# Patient Record
Sex: Female | Born: 1965 | Race: White | Hispanic: Yes | Marital: Married | Smoking: Never smoker
Health system: Southern US, Community
[De-identification: ages and names within clinical notes are randomized; demographics above are authoritative.]

---

## 2020-06-29 ENCOUNTER — Other Ambulatory Visit: Payer: Self-pay | Admitting: Obstetrics and Gynecology

## 2020-06-29 DIAGNOSIS — Z1231 Encounter for screening mammogram for malignant neoplasm of breast: Secondary | ICD-10-CM

## 2020-08-23 ENCOUNTER — Ambulatory Visit: Payer: Self-pay

## 2020-10-04 ENCOUNTER — Ambulatory Visit: Payer: Self-pay

## 2020-10-20 ENCOUNTER — Ambulatory Visit: Payer: Self-pay

## 2020-10-23 ENCOUNTER — Emergency Department (HOSPITAL_COMMUNITY): Payer: HRSA Program

## 2020-10-23 ENCOUNTER — Ambulatory Visit (HOSPITAL_COMMUNITY): Payer: Self-pay

## 2020-10-23 ENCOUNTER — Other Ambulatory Visit: Payer: Self-pay

## 2020-10-23 ENCOUNTER — Encounter (HOSPITAL_COMMUNITY): Payer: Self-pay | Admitting: Internal Medicine

## 2020-10-23 ENCOUNTER — Inpatient Hospital Stay (HOSPITAL_COMMUNITY)
Admission: EM | Admit: 2020-10-23 | Discharge: 2020-10-28 | DRG: 177 | Disposition: A | Discharge status: Home / Self Care | Payer: HRSA Program | Attending: Internal Medicine | Admitting: Internal Medicine

## 2020-10-23 DIAGNOSIS — J9601 Acute respiratory failure with hypoxia: Principal | ICD-10-CM

## 2020-10-23 DIAGNOSIS — A419 Sepsis, unspecified organism: Secondary | ICD-10-CM

## 2020-10-23 DIAGNOSIS — A0839 Other viral enteritis: Secondary | ICD-10-CM

## 2020-10-23 DIAGNOSIS — U071 COVID-19: Secondary | ICD-10-CM

## 2020-10-23 DIAGNOSIS — J1282 Pneumonia due to coronavirus disease 2019: Secondary | ICD-10-CM

## 2020-10-23 DIAGNOSIS — A4189 Other specified sepsis: Secondary | ICD-10-CM | POA: Diagnosis present

## 2020-10-23 LAB — CBC WITH DIFFERENTIAL/PLATELET
Abs Immature Granulocytes: 0.02 10*3/uL (ref 0.00–0.07)
Abs Immature Granulocytes: 0.02 10*3/uL (ref 0.00–0.07)
Basophils Absolute: 0 10*3/uL (ref 0.0–0.1)
Basophils Absolute: 0 10*3/uL (ref 0.0–0.1)
Basophils Relative: 0 %
Basophils Relative: 0 %
Eosinophils Absolute: 0 10*3/uL (ref 0.0–0.5)
Eosinophils Absolute: 0 10*3/uL (ref 0.0–0.5)
Eosinophils Relative: 0 %
Eosinophils Relative: 0 %
HCT: 39.1 % (ref 36.0–46.0)
HCT: 38.4 % (ref 36.0–46.0)
Hemoglobin: 13 g/dL (ref 12.0–15.0)
Hemoglobin: 12.7 g/dL (ref 12.0–15.0)
Immature Granulocytes: 0 %
Immature Granulocytes: 0 %
Lymphocytes Relative: 9 %
Lymphocytes Relative: 14 %
Lymphs Abs: 0.6 10*3/uL — ABNORMAL LOW (ref 0.7–4.0)
Lymphs Abs: 0.7 10*3/uL (ref 0.7–4.0)
MCH: 26.2 pg (ref 26.0–34.0)
MCH: 26.5 pg (ref 26.0–34.0)
MCHC: 33.2 g/dL (ref 30.0–36.0)
MCHC: 33.1 g/dL (ref 30.0–36.0)
MCV: 78.8 fL — ABNORMAL LOW (ref 80.0–100.0)
MCV: 80 fL (ref 80.0–100.0)
Monocytes Absolute: 0.2 10*3/uL (ref 0.1–1.0)
Monocytes Absolute: 0.3 10*3/uL (ref 0.1–1.0)
Monocytes Relative: 4 %
Monocytes Relative: 5 %
Neutro Abs: 5.5 10*3/uL (ref 1.7–7.7)
Neutro Abs: 4.1 10*3/uL (ref 1.7–7.7)
Neutrophils Relative %: 87 %
Neutrophils Relative %: 81 %
Platelets: 241 10*3/uL (ref 150–400)
Platelets: 227 10*3/uL (ref 150–400)
RBC: 4.96 MIL/uL (ref 3.87–5.11)
RBC: 4.8 MIL/uL (ref 3.87–5.11)
RDW: 13.4 % (ref 11.5–15.5)
RDW: 13.3 % (ref 11.5–15.5)
WBC: 6.3 10*3/uL (ref 4.0–10.5)
WBC: 5.1 10*3/uL (ref 4.0–10.5)
nRBC: 0 % (ref 0.0–0.2)
nRBC: 0 % (ref 0.0–0.2)

## 2020-10-23 LAB — COMPREHENSIVE METABOLIC PANEL
ALT: 51 U/L — ABNORMAL HIGH (ref 0–44)
AST: 69 U/L — ABNORMAL HIGH (ref 15–41)
Albumin: 2.8 g/dL — ABNORMAL LOW (ref 3.5–5.0)
Alkaline Phosphatase: 115 U/L (ref 38–126)
Anion gap: 11 (ref 5–15)
BUN: 10 mg/dL (ref 6–20)
CO2: 23 mmol/L (ref 22–32)
Calcium: 8.5 mg/dL — ABNORMAL LOW (ref 8.9–10.3)
Chloride: 99 mmol/L (ref 98–111)
Creatinine, Ser: 0.76 mg/dL (ref 0.44–1.00)
GFR, Estimated: >60 mL/min (ref >60)
Glucose, Bld: 133 mg/dL — ABNORMAL HIGH (ref 70–99)
Potassium: 4.3 mmol/L (ref 3.5–5.1)
Sodium: 133 mmol/L — ABNORMAL LOW (ref 135–145)
Total Bilirubin: 0.8 mg/dL (ref 0.3–1.2)
Total Protein: 6.8 g/dL (ref 6.5–8.1)

## 2020-10-23 LAB — FERRITIN: Ferritin: 1223 ng/mL — ABNORMAL HIGH (ref 11–307)

## 2020-10-23 LAB — C-REACTIVE PROTEIN: CRP: 17.6 mg/dL — ABNORMAL HIGH (ref <1.0)

## 2020-10-23 LAB — TRIGLYCERIDES: Triglycerides: 119 mg/dL (ref <150)

## 2020-10-23 LAB — LACTIC ACID, PLASMA: Lactic Acid, Venous: 1 mmol/L (ref 0.5–1.9)

## 2020-10-23 LAB — D-DIMER, QUANTITATIVE: D-Dimer, Quant: 1.13 ug/mL-FEU — ABNORMAL HIGH (ref 0.00–0.50)

## 2020-10-23 LAB — PROCALCITONIN: Procalcitonin: 0.26 ng/mL

## 2020-10-23 LAB — LACTATE DEHYDROGENASE: LDH: 408 U/L — ABNORMAL HIGH (ref 98–192)

## 2020-10-23 LAB — FIBRINOGEN: Fibrinogen: 682 mg/dL — ABNORMAL HIGH (ref 210–475)

## 2020-10-23 LAB — SARS CORONAVIRUS 2 BY RT PCR (HOSPITAL ORDER, PERFORMED IN ~~LOC~~ HOSPITAL LAB): SARS Coronavirus 2: POSITIVE — AB

## 2020-10-23 MED ORDER — ONDANSETRON 4 MG PO TBDP
4.0000 mg | ORAL_TABLET | Freq: Once | ORAL | Status: AC
  Administered 2020-10-23: 4 mg via ORAL
  Filled 2020-10-23: qty 1

## 2020-10-23 MED ORDER — ALBUTEROL SULFATE HFA 108 (90 BASE) MCG/ACT IN AERS
2.0000 | INHALATION_SPRAY | RESPIRATORY_TRACT | Status: DC | PRN
  Administered 2020-10-24: 2 via RESPIRATORY_TRACT
  Filled 2020-10-23: qty 6.7

## 2020-10-23 MED ORDER — SODIUM CHLORIDE 0.9 % IV SOLN: INTRAVENOUS | Status: AC

## 2020-10-23 MED ORDER — ASCORBIC ACID 500 MG PO TABS
500.0000 mg | ORAL_TABLET | Freq: Every day | ORAL | Status: DC
  Administered 2020-10-24 – 2020-10-28 (×5): 500 mg via ORAL
  Filled 2020-10-23 (×5): qty 1

## 2020-10-23 MED ORDER — PREDNISONE 20 MG PO TABS: 50.0000 mg | ORAL_TABLET | Freq: Every day | ORAL | Status: DC

## 2020-10-23 MED ORDER — ACETAMINOPHEN 325 MG PO TABS
650.0000 mg | ORAL_TABLET | Freq: Once | ORAL | Status: AC
  Administered 2020-10-23: 650 mg via ORAL
  Filled 2020-10-23: qty 2

## 2020-10-23 MED ORDER — SODIUM CHLORIDE 0.9 % IV SOLN
100.0000 mg | Freq: Every day | INTRAVENOUS | Status: AC
  Administered 2020-10-24 – 2020-10-27 (×4): 100 mg via INTRAVENOUS
  Filled 2020-10-23 (×4): qty 20

## 2020-10-23 MED ORDER — SODIUM CHLORIDE 0.9 % IV SOLN
200.0000 mg | Freq: Once | INTRAVENOUS | Status: AC
  Administered 2020-10-24: 200 mg via INTRAVENOUS
  Filled 2020-10-23: qty 200

## 2020-10-23 MED ORDER — METHYLPREDNISOLONE SODIUM SUCC 125 MG IJ SOLR
0.5000 mg/kg | Freq: Two times a day (BID) | INTRAMUSCULAR | Status: AC
  Administered 2020-10-24 – 2020-10-26 (×6): 41.25 mg via INTRAVENOUS
  Filled 2020-10-23 (×6): qty 2

## 2020-10-23 MED ORDER — GUAIFENESIN-DM 100-10 MG/5ML PO SYRP: 10.0000 mL | ORAL_SOLUTION | ORAL | Status: DC | PRN

## 2020-10-23 MED ORDER — HYDROCOD POLST-CPM POLST ER 10-8 MG/5ML PO SUER
5.0000 mL | Freq: Two times a day (BID) | ORAL | Status: DC | PRN
  Administered 2020-10-25: 5 mL via ORAL
  Filled 2020-10-23: qty 5

## 2020-10-23 MED ORDER — ENOXAPARIN SODIUM 40 MG/0.4ML ~~LOC~~ SOLN
40.0000 mg | SUBCUTANEOUS | Status: DC
  Administered 2020-10-24 – 2020-10-27 (×5): 40 mg via SUBCUTANEOUS
  Filled 2020-10-23 (×5): qty 0.4

## 2020-10-23 MED ORDER — VITAMIN D 25 MCG (1000 UNIT) PO TABS
1000.0000 [IU] | ORAL_TABLET | Freq: Every day | ORAL | Status: DC
  Administered 2020-10-24 – 2020-10-28 (×5): 1000 [IU] via ORAL
  Filled 2020-10-23 (×5): qty 1

## 2020-10-23 MED ORDER — ONDANSETRON HCL 4 MG/2ML IJ SOLN: 4.0000 mg | Freq: Four times a day (QID) | INTRAMUSCULAR | Status: DC | PRN

## 2020-10-23 MED ORDER — BARICITINIB 2 MG PO TABS
4.0000 mg | ORAL_TABLET | Freq: Every day | ORAL | Status: DC
  Administered 2020-10-24 – 2020-10-28 (×6): 4 mg via ORAL
  Filled 2020-10-23 (×6): qty 2

## 2020-10-23 MED ORDER — ZINC SULFATE 220 (50 ZN) MG PO CAPS
220.0000 mg | ORAL_CAPSULE | Freq: Every day | ORAL | Status: DC
  Administered 2020-10-24 – 2020-10-28 (×5): 220 mg via ORAL
  Filled 2020-10-23 (×5): qty 1

## 2020-10-23 MED ORDER — LOPERAMIDE HCL 2 MG PO CAPS: 2.0000 mg | ORAL_CAPSULE | ORAL | Status: DC | PRN

## 2020-10-23 MED ORDER — ACETAMINOPHEN 325 MG PO TABS: 650.0000 mg | ORAL_TABLET | Freq: Four times a day (QID) | ORAL | Status: DC | PRN

## 2020-10-23 MED ORDER — SODIUM CHLORIDE 0.9 % IV BOLUS
500.0000 mL | Freq: Once | INTRAVENOUS | Status: AC
  Administered 2020-10-23: 500 mL via INTRAVENOUS

## 2020-10-23 NOTE — ED Notes (Signed)
Pt assisted to bedside commode

## 2020-10-23 NOTE — H&P (Signed)
History and Physical    Sabrina Russell RXV:[id number] DOB: 1966-09-28 DOA: 10/23/2020  PCP: No primary care provider on file. Patient coming from: Home  Chief Complaint: Fever  HPI: Sabrina Russell is a 55 y.o. female with no significant past medical history presenting with complaints of fever and cough.  Patient states she has been sick for the past 2 weeks.  She is having fevers and cough.  Her appetite is poor.  She is having nausea but has not vomited.  She is also having some diarrhea.  Denies abdominal pain.  She feels her breathing is okay.  Denies chest pain.  She is not vaccinated against COVID.  States her husband is also sick with similar symptoms.  Denies history of any medical problems.  ED Course: Febrile with temperature 100.3 F.  Tachycardic on arrival.  Tachypneic.  Blood pressure soft.  Satting 87% on room air with ambulation, placed on supplemental oxygen.  SARS-CoV-2 PCR test positive.  WBC 6.3, hemoglobin 13.0, platelet count 241K.  Sodium 133, potassium 4.3, chloride 99, bicarb 23, BUN 10, creatinine 0.7, glucose 133.  AST 69, ALT 51.  Alk phos and T bili normal.  Blood culture x2 pending.  Lactic acid 1.0.  Inflammatory markers pending.  Procalcitonin level pending.  Chest x-ray showing bilateral pulmonary opacities, left greater than right most consistent with multifocal pneumonia. Patient was given Tylenol, Zofran, and 500 cc normal saline.  Review of Systems:  All systems reviewed and apart from history of presenting illness, are negative.  History reviewed. No pertinent past medical history.  History reviewed. No pertinent surgical history.   reports that she has never smoked. She has never used smokeless tobacco. She reports that she does not drink alcohol and does not use drugs.  No Known Allergies  History reviewed. No pertinent family history.  Prior to Admission medications   Medication Sig Start Date End Date Taking? Authorizing Provider   Pseudoeph-Doxylamine-DM-APAP (NYQUIL PO) Take 1 Dose by mouth daily as needed (cold symptoms).   Yes [provider]  Pseudoephedrine-APAP-DM (DAYQUIL MULTI-SYMPTOM PO) Take 1 Dose by mouth daily as needed (cold symptoms).   Yes [provider]    Physical Exam: Vitals:   10/23/20 1819 10/23/20 2108 10/23/20 2201 10/23/20 2230  BP: (!) 95/53 103/66 105/64 101/65  Pulse: 93 79 80 70  Resp: 20 (!) 21 (!) 22 (!) 29  Temp:  98.3 F (36.8 C)    TempSrc:  Oral    SpO2: 93% 94% (!) 89% 93%  Weight:      Height:        Physical Exam Constitutional:      General: She is not in acute distress.    Appearance: She is ill-appearing.  HENT:     Head: Normocephalic and atraumatic.  Eyes:     Extraocular Movements: Extraocular movements intact.     Conjunctiva/sclera: Conjunctivae normal.  Cardiovascular:     Rate and Rhythm: Normal rate and regular rhythm.     Pulses: Normal pulses.  Pulmonary:     Breath sounds: No wheezing.     Comments: Respiratory rate in the mid 20s Satting 95% on 3 L supplemental oxygen Abdominal:     General: Bowel sounds are normal. There is no distension.     Palpations: Abdomen is soft.     Tenderness: There is no abdominal tenderness.  Musculoskeletal:        General: No swelling or tenderness.     Cervical back: Normal range of  motion and neck supple.  Skin:    General: Skin is warm and dry.  Neurological:     General: No focal deficit present.     Mental Status: She is alert and oriented to person, place, and time.     Labs on Admission: I have personally reviewed following labs and imaging studies  CBC: Recent Labs  Lab 10/23/20 1445 10/23/20 2222  WBC 6.3 5.1  NEUTROABS 5.5 4.1  HGB 13.0 12.7  HCT 39.1 38.4  MCV 78.8* 80.0  PLT 241 227   Basic Metabolic Panel: Recent Labs  Lab 10/23/20 1445  NA 133*  K 4.3  CL 99  CO2 23  GLUCOSE 133*  BUN 10  CREATININE 0.76  CALCIUM 8.5*   GFR: Estimated Creatinine  Clearance: 93.2 mL/min (by C-G formula based on SCr of 0.76 mg/dL). Liver Function Tests: Recent Labs  Lab 10/23/20 1445  AST 69*  ALT 51*  ALKPHOS 115  BILITOT 0.8  PROT 6.8  ALBUMIN 2.8*   No results for input(s): LIPASE, AMYLASE in the last 168 hours. No results for input(s): AMMONIA in the last 168 hours. Coagulation Profile: No results for input(s): INR, PROTIME in the last 168 hours. Cardiac Enzymes: No results for input(s): CKTOTAL, CKMB, CKMBINDEX, TROPONINI in the last 168 hours. BNP (last 3 results) No results for input(s): PROBNP in the last 8760 hours. HbA1C: No results for input(s): HGBA1C in the last 72 hours. CBG: No results for input(s): GLUCAP in the last 168 hours. Lipid Profile: Recent Labs    10/23/20 2157  TRIG 119   Thyroid Function Tests: No results for input(s): TSH, T4TOTAL, FREET4, T3FREE, THYROIDAB in the last 72 hours. Anemia Panel: Recent Labs    10/23/20 2222  FERRITIN 1,223*   Urine analysis: No results found for: COLORURINE, APPEARANCEUR, LABSPEC, PHURINE, GLUCOSEU, HGBUR, BILIRUBINUR, KETONESUR, PROTEINUR, UROBILINOGEN, NITRITE, LEUKOCYTESUR  Radiological Exams on Admission: DG Chest Portable 1 View  Result Date: 10/23/2020 CLINICAL DATA:  54-year-old female with cough and fever. EXAM: PORTABLE CHEST 1 VIEW COMPARISON:  None. FINDINGS: Bilateral pulmonary opacities, left greater right most consistent with multifocal pneumonia. Clinical correlation and follow-up to resolution recommended. There is no pleural effusion pneumothorax. Mild cardiomegaly. No acute osseous pathology. IMPRESSION: Multifocal pneumonia. Electronically Signed   By: Arash  Radparvar M.D.   On: 10/23/2020 15:18    EKG: Independently reviewed.  Sinus rhythm, no prior tracing for comparison.  Assessment/Plan Principal Problem:   Pneumonia due to COVID-19 virus Active Problems:   Acute hypoxemic respiratory failure (HCC)   Sepsis (HCC)   Gastroenteritis due to  COVID-19 virus   Sepsis and acute hypoxemic respiratory failure secondary to COVID-19 viral pneumonia: She is unvaccinated.  SPO2 87% on room air with ambulation, currently satting in the mid 90s on 3 L supplemental oxygen.  Respiratory rate in the mid 20s.  Met criteria for sepsis in the ED-3 SIRS (fever, tachycardia, tachypnea) and SARS-CoV-2 PCR test positive.  Blood pressure soft initially but improved with 500 cc fluid bolus and tachycardia resolved.  No lactic acidosis to suggest severe sepsis.  Chest x-ray showing bilateral pulmonary opacities, left greater than right most consistent with multifocal pneumonia.  No leukocytosis to suggest bacterial pneumonia. -Remdesivir -IV Solu-Medrol 0.5 mg/kg every 12 hours -Discussed FDA's emergency use authorization of baricitinib for the treatment of Covid infection in hospitalized patients requiring supplemental oxygen.  Patient denies history of hepatitis, HIV, malignancy, or active TB.  Risks versus benefits discussed with the patient and she   wishes to proceed with using baricitinib to treat her illness.  Start baricitinib 4 mg daily. -Vitamin C, zinc, vitamin D -Antitussives as needed -Tylenol as needed -Bronchodilator as needed -Inflammatory markers including ferritin, fibrinogen, D-dimer, CRP, LDH pending -Procalcitonin level pending -Daily CBC with differential, CMP, CRP, D-dimer -Airborne and contact precautions -Incentive spirometry, flutter valve -Encourage prone positioning -Continuous pulse ox -Supplemental oxygen as needed to keep oxygen saturation above 90% -Blood culture x2 pending -Encourage mobility-PT/OT  COVID-19 viral gastroenteritis: Complaining of nausea and diarrhea.  Transaminases mildly elevated in the setting of acute viral illness.  Alk phos and T bili normal.  No leukocytosis on labs.  Abdominal exam benign. -Symptomatic management: Antiemetic as needed, loperamide as needed, gentle IV fluid hydration  DVT prophylaxis:  Lovenox Code Status: Full code Family Communication: No family available at this time. Disposition Plan: Status is: Inpatient  Remains inpatient appropriate because:IV treatments appropriate due to intensity of illness or inability to take PO, Inpatient level of care appropriate due to severity of illness and Acute hypoxemic respiratory failure secondary to COVID-19 viral pneumonia   Dispo: The patient is from: Home              Anticipated d/c is to: Home              Anticipated d/c date is: 3 days              Patient currently is not medically stable to d/c.   Difficult to place patient No  The medical decision making on this patient was of high complexity and the patient is at high risk for clinical deterioration, therefore this is a level 3 visit.  Shela Leff MD [facility]  If 7PM-7AM, please contact night-coverage [email or web address]  10/23/2020, 11:39 PM

## 2020-10-23 NOTE — ED Notes (Signed)
spo2 drops to 87% on room air while ambulating.

## 2020-10-23 NOTE — ED Triage Notes (Signed)
C/o feeling sick x 2 weeks; fever, NV, diarrhea.

## 2020-10-23 NOTE — ED Provider Notes (Signed)
[facility] EMERGENCY DEPARTMENT Provider Note   CSN: [id number] Arrival date & time: 10/23/20  1401     History Chief Complaint  Patient presents with  . Fever    Sabrina Russell is a 55 y.o. female with no past medical history.  Patient presents with chief complaint of cough and fever.  Patient reports that her symptoms began 2 weeks prior.  Patient reports that her cough has worsened over that time, cough is productive, worse at night, no relief with over-the-counter cough medications.  Patient also endorses nausea and headaches intermittently over the last 2 weeks.  Patient also complains of pleuritic chest pain and chest pain with coughing.  Patient denies any chest pain at rest or with exertion.  Patient denies any recent sick contacts.  Patient denies any vaccination for COVID-19 or influenza.  Patient denies any lung disease.  Patient denies any oxygen use at home.  Patient states that her husband is also sick with similar symptoms.  HPI     No past medical history on file.  There are no problems to display for this patient.     OB History   No obstetric history on file.     No family history on file.     Home Medications Prior to Admission medications   Not on File    Allergies    Patient has no known allergies.  Review of Systems   Review of Systems  Constitutional: Positive for chills and fever.  HENT: Positive for rhinorrhea. Negative for congestion and sore throat.   Eyes: Negative for visual disturbance.  Respiratory: Positive for cough (productive). Negative for shortness of breath.   Cardiovascular: Positive for chest pain (with coughing and pleuritic). Negative for palpitations and leg swelling.  Gastrointestinal: Positive for nausea. Negative for abdominal pain and vomiting.  Genitourinary: Negative for difficulty urinating and dysuria.  Musculoskeletal: Negative for back pain and neck pain.  Skin: Negative for color change  and rash.  Neurological: Positive for headaches. Negative for dizziness, syncope and light-headedness.  Psychiatric/Behavioral: Negative for confusion.    Physical Exam Updated Vital Signs BP 103/66   Pulse 79   Temp 98.3 F (36.8 C) (Oral)   Resp (!) 21   Ht 5' 8.9" (1.75 m)   Wt 82.6 kg   LMP  (LMP Unknown)   SpO2 94%   BMI 26.96 kg/m   Physical Exam Vitals and nursing note reviewed.  Constitutional:      General: She is not in acute distress.    Appearance: She is obese. She is ill-appearing (labored breathing, uncomfortable appearing). She is not toxic-appearing or diaphoretic.  HENT:     Head: Normocephalic.  Eyes:     General: No scleral icterus.       Right eye: No discharge.        Left eye: No discharge.  Cardiovascular:     Rate and Rhythm: Normal rate.     Heart sounds: Normal heart sounds.  Pulmonary:     Effort: Tachypnea present. No accessory muscle usage or respiratory distress.     Breath sounds: Normal breath sounds. No stridor. No decreased breath sounds, wheezing, rhonchi or rales.  Abdominal:     General: Abdomen is protuberant.     Palpations: Abdomen is soft. There is no mass or pulsatile mass.     Tenderness: There is no abdominal tenderness. There is no guarding.  Musculoskeletal:     Cervical back: Neck supple. No rigidity.  Right lower leg: No swelling or tenderness. No edema.     Left lower leg: No swelling or tenderness. No edema.  Skin:    General: Skin is warm and dry.     Coloration: Skin is not pale.  Neurological:     General: No focal deficit present.     Mental Status: She is alert.  Psychiatric:        Behavior: Behavior is cooperative.     ED Results / Procedures / Treatments   Labs (all labs ordered are listed, but only abnormal results are displayed) Labs Reviewed  SARS CORONAVIRUS 2 BY RT PCR (HOSPITAL ORDER, PERFORMED IN [facility]) - Abnormal; Notable for the following components:      Result Value    SARS Coronavirus 2 POSITIVE (*)    All other components within normal limits  CBC WITH DIFFERENTIAL/PLATELET - Abnormal; Notable for the following components:   MCV 78.8 (*)    Lymphs Abs 0.6 (*)    All other components within normal limits  COMPREHENSIVE METABOLIC PANEL - Abnormal; Notable for the following components:   Sodium 133 (*)    Glucose, Bld 133 (*)    Calcium 8.5 (*)    Albumin 2.8 (*)    AST 69 (*)    ALT 51 (*)    All other components within normal limits  CULTURE, BLOOD (ROUTINE X 2)  CULTURE, BLOOD (ROUTINE X 2)  LACTIC ACID, PLASMA  LACTIC ACID, PLASMA    EKG EKG Interpretation  Date/Time:  Sunday October 23 2020 21:53:54 EST Ventricular Rate:  82 PR Interval:    QRS Duration: 84 QT Interval:  394 QTC Calculation: 461 R Axis:   3 Text Interpretation: Sinus rhythm Consider left atrial enlargement No previous tracing Confirmed by Tilden Fossa [phone]) on 10/23/2020 9:59:33 PM   Radiology DG Chest Portable 1 View  Result Date: 10/23/2020 CLINICAL DATA:  55 year old female with cough and fever. EXAM: PORTABLE CHEST 1 VIEW COMPARISON:  None. FINDINGS: Bilateral pulmonary opacities, left greater right most consistent with multifocal pneumonia. Clinical correlation and follow-up to resolution recommended. There is no pleural effusion pneumothorax. Mild cardiomegaly. No acute osseous pathology. IMPRESSION: Multifocal pneumonia. Electronically Signed   By: Elgie Collard M.D.   On: 10/23/2020 15:18    Procedures .Critical Care Performed by: Haskel Schroeder, PA-C Authorized by: Haskel Schroeder, PA-C   Critical care provider statement:    Critical care time (minutes):  30   Critical care was necessary to treat or prevent imminent or life-threatening deterioration of the following conditions:  Respiratory failure   Critical care was time spent personally by me on the following activities:  Discussions with consultants, evaluation of patient's  response to treatment, examination of patient, ordering and performing treatments and interventions, ordering and review of laboratory studies, ordering and review of radiographic studies, pulse oximetry, re-evaluation of patient's condition and obtaining history from patient or surrogate   Care discussed with: admitting provider     (including critical care time)  Medications Ordered in ED Medications  acetaminophen (TYLENOL) tablet 650 mg (650 mg Oral Given 10/23/20 2203)  ondansetron (ZOFRAN-ODT) disintegrating tablet 4 mg (4 mg Oral Given 10/23/20 2203)  sodium chloride 0.9 % bolus 500 mL (500 mLs Intravenous New Bag/Given 10/23/20 2240)    ED Course  I have reviewed the triage vital signs and the nursing notes.  Pertinent labs & imaging results that were available during my care of the patient were reviewed by  me and considered in my medical decision making (see chart for details).    MDM Rules/Calculators/A&P                          Alert 55 year old in no acute distress, ill appearing with tachypnea, increased work of breathing and appears uncomfortable.  Patient is on 3 L of oxygen via nasal cannula.  Patient denies any history of lung disease or any oxygen use at home.  During interview oxygen was turned off and oxygen saturation was found to drop to 87% on room air at rest.  Lungs clear to auscultation bilaterally.  Noted to have nonproductive cough; coughing frequently throughout interview.  Patient reports productive cough, fever, nausea, and headache for 2 weeks.  COVID-19 test positive.  Chest x-ray shows multifocal pneumonia.  Patient's symptoms and increased work of breathing breathing are likely secondary to COVID-19 infection.  Due to patient's positive COVID 19 test, increased work of breathing, and pleuritic chest pain D-dimer ordered and pending.  If positive will follow-up with CT angio chest.  Will check oxygen saturation with ambulation.  Likely patient will need  admission due to hypoxia associated with her COVID-19 infection.  Lactic acid within normal limits at 1.0.  CBC was unremarkable.  CMP showed AST and ALT elevated at 69 and 51 respectively.  Patient denies any abdominal pain abdomen is soft, nondistended, nontender, no guarding.  Blood culture pending.    Patient was given Tylenol, zofran and normal saline fluid bolus.    22:54 spoke with Dr. Loney Loh, who will see the patient for admission.    Critical care procedure was placed due to respiratory failure.  Patient has hypoxia secondary to covid 19 pneumonia.    Patient was discussed with and evaluated by Dr. Madilyn Hook.    Sabrina Russell was evaluated in Emergency Department on 10/23/2020 for the symptoms described in the history of present illness. She was evaluated in the context of the global COVID-19 pandemic, which necessitated consideration that the patient might be at risk for infection with the SARS-CoV-2 virus that causes COVID-19. Institutional protocols and algorithms that pertain to the evaluation of patients at risk for COVID-19 are in a state of rapid change based on information released by regulatory bodies including the CDC and federal and state organizations. These policies and algorithms were followed during the patient's care in the ED.     Final Clinical Impression(s) / ED Diagnoses Final diagnoses:  COVID-19  Acute respiratory failure with hypoxia [facility])    Rx / DC Orders ED Discharge Orders    None       Berneice Heinrich 10/23/20 2322    Tilden Fossa, MD 10/27/20 [phone]

## 2020-10-24 LAB — CBC WITH DIFFERENTIAL/PLATELET
Abs Immature Granulocytes: 0.01 10*3/uL (ref 0.00–0.07)
Basophils Absolute: 0 10*3/uL (ref 0.0–0.1)
Basophils Relative: 0 %
Eosinophils Absolute: 0 10*3/uL (ref 0.0–0.5)
Eosinophils Relative: 0 %
HCT: 34.8 % — ABNORMAL LOW (ref 36.0–46.0)
Hemoglobin: 11.9 g/dL — ABNORMAL LOW (ref 12.0–15.0)
Immature Granulocytes: 0 %
Lymphocytes Relative: 16 %
Lymphs Abs: 0.6 10*3/uL — ABNORMAL LOW (ref 0.7–4.0)
MCH: 27 pg (ref 26.0–34.0)
MCHC: 34.2 g/dL (ref 30.0–36.0)
MCV: 79.1 fL — ABNORMAL LOW (ref 80.0–100.0)
Monocytes Absolute: 0.1 10*3/uL (ref 0.1–1.0)
Monocytes Relative: 3 %
Neutro Abs: 2.9 10*3/uL (ref 1.7–7.7)
Neutrophils Relative %: 81 %
Platelets: 212 10*3/uL (ref 150–400)
RBC: 4.4 MIL/uL (ref 3.87–5.11)
RDW: 13.5 % (ref 11.5–15.5)
WBC: 3.6 10*3/uL — ABNORMAL LOW (ref 4.0–10.5)
nRBC: 0 % (ref 0.0–0.2)

## 2020-10-24 LAB — COMPREHENSIVE METABOLIC PANEL
ALT: 56 U/L — ABNORMAL HIGH (ref 0–44)
AST: 78 U/L — ABNORMAL HIGH (ref 15–41)
Albumin: 2.6 g/dL — ABNORMAL LOW (ref 3.5–5.0)
Alkaline Phosphatase: 101 U/L (ref 38–126)
Anion gap: 10 (ref 5–15)
BUN: 7 mg/dL (ref 6–20)
CO2: 23 mmol/L (ref 22–32)
Calcium: 8.3 mg/dL — ABNORMAL LOW (ref 8.9–10.3)
Chloride: 103 mmol/L (ref 98–111)
Creatinine, Ser: 0.63 mg/dL (ref 0.44–1.00)
GFR, Estimated: >60 mL/min (ref >60)
Glucose, Bld: 145 mg/dL — ABNORMAL HIGH (ref 70–99)
Potassium: 4.2 mmol/L (ref 3.5–5.1)
Sodium: 136 mmol/L (ref 135–145)
Total Bilirubin: 0.9 mg/dL (ref 0.3–1.2)
Total Protein: 6.1 g/dL — ABNORMAL LOW (ref 6.5–8.1)

## 2020-10-24 LAB — D-DIMER, QUANTITATIVE: D-Dimer, Quant: 1.02 ug/mL-FEU — ABNORMAL HIGH (ref 0.00–0.50)

## 2020-10-24 LAB — PROCALCITONIN: Procalcitonin: 0.23 ng/mL

## 2020-10-24 LAB — C-REACTIVE PROTEIN: CRP: 14.8 mg/dL — ABNORMAL HIGH (ref <1.0)

## 2020-10-24 NOTE — Progress Notes (Signed)
2200 Patient took herself off oxygen and went to bathroom.Patient oxygen saturations dropped to 80%.Assisted patient back to bed and oxygen placed back on at 3 liters but oxygen saturation 84%. Respiratory therapy called for high flow oxygen.Oxygen gradually increased up to 13 liters high flow to maintain  oxygen saturations of 90%. Dr.Zierle-Ghosh notified of above.New orders received.

## 2020-10-24 NOTE — ED Notes (Signed)
Pt disconnected from monitoring and assisted to bedside commode Pt with increased shortness of breath and labored breathing with acitibity O2 sats dropped to 84% during ambulation and then back to 92% after resting, remains on 3L [place].  Skin is warm,dry and intact Call light within reach

## 2020-10-24 NOTE — ED Notes (Signed)
RN attempted to call report 

## 2020-10-24 NOTE — ED Notes (Signed)
Family updated at this time.

## 2020-10-24 NOTE — ED Notes (Signed)
Tele Breakfast Order Placed 

## 2020-10-24 NOTE — ED Notes (Signed)
Pt seated on side of bed to eat breakfast

## 2020-10-24 NOTE — ED Notes (Signed)
RN attempted report 

## 2020-10-24 NOTE — Progress Notes (Signed)
Called to get report no answer.

## 2020-10-24 NOTE — Progress Notes (Signed)
PROGRESS NOTE    Sabrina Russell  XKG:[id number] DOB: 02-06-1966 DOA: 10/23/2020 PCP: Pcp, No   Brief Narrative: Taken from H&P. Sabrina Russell is a 55 y.o. female with no significant past medical history presenting with complaints of fever and cough.  Patient states she has been sick for the past 2 weeks.  She is having fevers and cough.  Her appetite is poor.  She is having nausea but has not vomited.  She is also having some diarrhea.  Denies abdominal pain.  She feels her breathing is okay.  Denies chest pain.  She is not vaccinated against COVID.  States her husband is also sick with similar symptoms.  Denies history of any medical problems. She was found to be febrile, positive for COVID-19, chest x-ray with bilateral pulmonary opacities, hypoxic requiring 3 to 4 L of oxygen. She was started on remdesivir, steroid and baricitinib.  Subjective: Patient continued to have some shortness of breath, stating that she is little improved as compared to yesterday.  Saturating barely at 88 to 90% on 3 L of oxygen.  Assessment & Plan:   Principal Problem:   Pneumonia due to COVID-19 virus Active Problems:   Acute hypoxemic respiratory failure (HCC)   Sepsis (HCC)   Gastroenteritis due to COVID-19 virus  Acute hypoxic respiratory failure secondary to COVID-19 pneumonia. Elevated inflammatory markers which started improving today. Procalcitonin at 0.26 and repeat today was 0.23. Continue to require 3 to 4 L of oxygen. -Continue with remdesivir and steroid-day 2 -Continue with baricitinib-day 2 -Supplemental oxygen to keep the saturation above 90% -Continue with supportive care and supplement -Prone as tolerated -Incentive spirometry and flutter valve. -Continue to monitor inflammatory markers.  Transaminitis.  Most likely secondary to COVID-19 infection and related gastroenteritis. -Continue to monitor  Objective: Vitals:   10/24/20 1100 10/24/20 1230 10/24/20 1400 10/24/20  1700  BP: 111/73 115/70 115/73 113/81  Pulse: 61 (!) 59 61 65  Resp:  20 20 (!) 29  Temp:   98.2 F (36.8 C)   TempSrc:   Oral   SpO2: 91% 90% 91% (!) 87%  Weight:      Height:        Intake/Output Summary (Last 24 hours) at 10/24/2020 1720 Last data filed at 10/24/2020 0240 Gross per 24 hour  Intake 750 ml  Output --  Net 750 ml   Filed Weights   10/23/20 1432  Weight: 82.6 kg    Examination:  General exam: Appears calm and comfortable  Respiratory system: Clear to auscultation. Respiratory effort normal. Cardiovascular system: S1 & S2 heard, RRR.  Gastrointestinal system: Soft, nontender, nondistended, bowel sounds positive. Central nervous system: Alert and oriented. No focal neurological deficits. Extremities: No edema, no cyanosis, pulses intact and symmetrical. Psychiatry: Judgement and insight appear normal.    DVT prophylaxis: Lovenox Code Status: Full Family Communication: Son was updated on phone. Disposition Plan:  Status is: Inpatient  Remains inpatient appropriate because:Inpatient level of care appropriate due to severity of illness   Dispo: The patient is from: Home              Anticipated d/c is to: Home              Anticipated d/c date is: 3 days              Patient currently is not medically stable to d/c.   Difficult to place patient No   Consultants:   None  Procedures:  Antimicrobials:   Data  Reviewed: I have personally reviewed following labs and imaging studies  CBC: Recent Labs  Lab 10/23/20 1445 10/23/20 2222 10/24/20 0453  WBC 6.3 5.1 3.6*  NEUTROABS 5.5 4.1 2.9  HGB 13.0 12.7 11.9*  HCT 39.1 38.4 34.8*  MCV 78.8* 80.0 79.1*  PLT 241 227 212   Basic Metabolic Panel: Recent Labs  Lab 10/23/20 1445 10/24/20 0453  NA 133* 136  K 4.3 4.2  CL 99 103  CO2 23 23  GLUCOSE 133* 145*  BUN 10 7  CREATININE 0.76 0.63  CALCIUM 8.5* 8.3*   GFR: Estimated Creatinine Clearance: 93.2 mL/min (by C-G formula based on SCr  of 0.63 mg/dL). Liver Function Tests: Recent Labs  Lab 10/23/20 1445 10/24/20 0453  AST 69* 78*  ALT 51* 56*  ALKPHOS 115 101  BILITOT 0.8 0.9  PROT 6.8 6.1*  ALBUMIN 2.8* 2.6*   No results for input(s): LIPASE, AMYLASE in the last 168 hours. No results for input(s): AMMONIA in the last 168 hours. Coagulation Profile: No results for input(s): INR, PROTIME in the last 168 hours. Cardiac Enzymes: No results for input(s): CKTOTAL, CKMB, CKMBINDEX, TROPONINI in the last 168 hours. BNP (last 3 results) No results for input(s): PROBNP in the last 8760 hours. HbA1C: No results for input(s): HGBA1C in the last 72 hours. CBG: No results for input(s): GLUCAP in the last 168 hours. Lipid Profile: Recent Labs    10/23/20 2157  TRIG 119   Thyroid Function Tests: No results for input(s): TSH, T4TOTAL, FREET4, T3FREE, THYROIDAB in the last 72 hours. Anemia Panel: Recent Labs    10/23/20 2222  FERRITIN 1,223*   Sepsis Labs: Recent Labs  Lab 10/23/20 1504 10/23/20 2222 10/24/20 0915  PROCALCITON  --  0.26 0.23  LATICACIDVEN 1.0  --   --     Recent Results (from the past 240 hour(s))  Blood culture (routine x 2)     Status: None (Preliminary result)   Collection Time: 10/23/20  2:35 PM   Specimen: BLOOD  Result Value Ref Range Status   Specimen Description BLOOD BLOOD RIGHT HAND  Final   Special Requests   Final    BOTTLES DRAWN AEROBIC AND ANAEROBIC Blood Culture adequate volume   Culture   Final    NO GROWTH 1 DAY Performed at [facility], [address] [place], [place] [address]    Report Status PENDING  Incomplete  SARS Coronavirus 2 by RT PCR (hospital order, performed in [facility] hospital lab) Nasopharyngeal Nasopharyngeal Swab     Status: Abnormal   Collection Time: 10/23/20  2:36 PM   Specimen: Nasopharyngeal Swab  Result Value Ref Range Status   SARS Coronavirus 2 POSITIVE (A) NEGATIVE Final    Comment: RESULT CALLED TO, READ BACK BY AND VERIFIED  WITH: RN L CAPAHIMICAN [id number] AT 1611 BY CM (NOTE) SARS-CoV-2 target nucleic acids are DETECTED  SARS-CoV-2 RNA is generally detectable in upper respiratory specimens  during the acute phase of infection.  Positive results are indicative  of the presence of the identified virus, but do not rule out bacterial infection or co-infection with other pathogens not detected by the test.  Clinical correlation with patient history and  other diagnostic information is necessary to determine patient infection status.  The expected result is negative.  Fact Sheet for Patients:   BoilerBrush.com.cy   Fact Sheet for Healthcare Providers:   https://pope.com/    This test is not yet approved or cleared by the [place]  FDA and  has been authorized for detection and/or diagnosis of SARS-CoV-2 by FDA under an Emergency Use Authorization (EUA).  This EUA will remain in effect (meaning this  test can be used) for the duration of  the COVID-19 declaration under Section 564(b)(1) of the Act, 21 U.S.C. section 360-bbb-3(b)(1), unless the authorization is terminated or revoked sooner.  Performed at [facility], [address] [place], [place] [address]   Blood culture (routine x 2)     Status: None (Preliminary result)   Collection Time: 10/23/20 10:10 PM   Specimen: BLOOD  Result Value Ref Range Status   Specimen Description BLOOD SITE NOT SPECIFIED  Final   Special Requests   Final    BOTTLES DRAWN AEROBIC AND ANAEROBIC Blood Culture adequate volume   Culture   Final    NO GROWTH < 24 HOURS Performed at [facility], [address] [place], [place] [address]    Report Status PENDING  Incomplete     Radiology Studies: DG Chest Portable 1 View  Result Date: 10/23/2020 CLINICAL DATA:  55 year old female with cough and fever. EXAM: PORTABLE CHEST 1 VIEW COMPARISON:  None. FINDINGS: Bilateral pulmonary opacities, left greater right  most consistent with multifocal pneumonia. Clinical correlation and follow-up to resolution recommended. There is no pleural effusion pneumothorax. Mild cardiomegaly. No acute osseous pathology. IMPRESSION: Multifocal pneumonia. Electronically Signed   By: Elgie Collard M.D.   On: 10/23/2020 15:18    Scheduled Meds: . vitamin C  500 mg Oral Daily  . baricitinib  4 mg Oral Daily  . cholecalciferol  1,000 Units Oral Daily  . enoxaparin (LOVENOX) injection  40 mg Subcutaneous Q24H  . methylPREDNISolone (SOLU-MEDROL) injection  0.5 mg/kg Intravenous Q12H   Followed by  . [START ON 10/27/2020] predniSONE  50 mg Oral Daily  . zinc sulfate  220 mg Oral Daily   Continuous Infusions: . remdesivir 100 mg in NS 100 mL 100 mg (10/24/20 1645)     LOS: 1 day   Time spent: 40 minutes.  Arnetha Courser, MD [facility]  If 7PM-7AM, please contact night-coverage [email or web address]  10/24/2020, 5:20 PM   This record has been created using Conservation officer, historic buildings. Errors have been sought and corrected,but may not always be located. Such creation errors do not reflect on the standard of care.

## 2020-10-25 ENCOUNTER — Other Ambulatory Visit: Payer: Self-pay

## 2020-10-25 LAB — CBC WITH DIFFERENTIAL/PLATELET
Abs Immature Granulocytes: 0.03 10*3/uL (ref 0.00–0.07)
Basophils Absolute: 0 10*3/uL (ref 0.0–0.1)
Basophils Relative: 0 %
Eosinophils Absolute: 0 10*3/uL (ref 0.0–0.5)
Eosinophils Relative: 0 %
HCT: 37.2 % (ref 36.0–46.0)
Hemoglobin: 12.1 g/dL (ref 12.0–15.0)
Immature Granulocytes: 1 %
Lymphocytes Relative: 15 %
Lymphs Abs: 0.8 10*3/uL (ref 0.7–4.0)
MCH: 26 pg (ref 26.0–34.0)
MCHC: 32.5 g/dL (ref 30.0–36.0)
MCV: 80 fL (ref 80.0–100.0)
Monocytes Absolute: 0.3 10*3/uL (ref 0.1–1.0)
Monocytes Relative: 6 %
Neutro Abs: 3.9 10*3/uL (ref 1.7–7.7)
Neutrophils Relative %: 78 %
Platelets: 268 10*3/uL (ref 150–400)
RBC: 4.65 MIL/uL (ref 3.87–5.11)
RDW: 13.2 % (ref 11.5–15.5)
WBC: 4.9 10*3/uL (ref 4.0–10.5)
nRBC: 0 % (ref 0.0–0.2)

## 2020-10-25 LAB — COMPREHENSIVE METABOLIC PANEL
ALT: 70 U/L — ABNORMAL HIGH (ref 0–44)
AST: 75 U/L — ABNORMAL HIGH (ref 15–41)
Albumin: 2.6 g/dL — ABNORMAL LOW (ref 3.5–5.0)
Alkaline Phosphatase: 102 U/L (ref 38–126)
Anion gap: 12 (ref 5–15)
BUN: 15 mg/dL (ref 6–20)
CO2: 24 mmol/L (ref 22–32)
Calcium: 8.7 mg/dL — ABNORMAL LOW (ref 8.9–10.3)
Chloride: 101 mmol/L (ref 98–111)
Creatinine, Ser: 0.67 mg/dL (ref 0.44–1.00)
GFR, Estimated: >60 mL/min (ref >60)
Glucose, Bld: 141 mg/dL — ABNORMAL HIGH (ref 70–99)
Potassium: 4.1 mmol/L (ref 3.5–5.1)
Sodium: 137 mmol/L (ref 135–145)
Total Bilirubin: 0.6 mg/dL (ref 0.3–1.2)
Total Protein: 6.5 g/dL (ref 6.5–8.1)

## 2020-10-25 LAB — BLOOD GAS, ARTERIAL
Acid-Base Excess: 1.2 mmol/L (ref 0.0–2.0)
Bicarbonate: 24.8 mmol/L (ref 20.0–28.0)
Drawn by: 51155
FIO2: 80
O2 Saturation: 92.9 %
Patient temperature: 37
pCO2 arterial: 36.5 mmHg (ref 32.0–48.0)
pH, Arterial: 7.447 (ref 7.350–7.450)
pO2, Arterial: 67.4 mmHg — ABNORMAL LOW (ref 83.0–108.0)

## 2020-10-25 LAB — D-DIMER, QUANTITATIVE: D-Dimer, Quant: 0.93 ug/mL-FEU — ABNORMAL HIGH (ref 0.00–0.50)

## 2020-10-25 LAB — PROCALCITONIN: Procalcitonin: 0.1 ng/mL

## 2020-10-25 LAB — C-REACTIVE PROTEIN: CRP: 8.6 mg/dL — ABNORMAL HIGH (ref <1.0)

## 2020-10-25 MED ORDER — TRAZODONE HCL 50 MG PO TABS
50.0000 mg | ORAL_TABLET | Freq: Every evening | ORAL | Status: DC | PRN
  Administered 2020-10-25: 50 mg via ORAL
  Filled 2020-10-25: qty 1

## 2020-10-25 NOTE — Progress Notes (Signed)
PROGRESS NOTE    Sabrina Russell  VFI:[id number] DOB: [birth year] DOA: 10/23/2020 PCP: Pcp, No   Brief Narrative: 55 year old with no past medical history who presents complaining of fever and cough.  She has been sick for 2 weeks.  She report poor appetite.  She also report mild diarrhea.  She was not vaccinated against Covid. She was found to be febrile, positive for COVID-19, chest x-ray with bilateral pulmonary opacity, hypoxic requiring 3 to 4 L.  Subsequently her oxygen requirement increased to 10 L. She has been getting Remdesivir, IV steroid and Baracitinib.     Assessment & Plan:   Principal Problem:   Pneumonia due to COVID-19 virus Active Problems:   Acute hypoxemic respiratory failure (HCC)   Sepsis (HCC)   Gastroenteritis due to COVID-19 virus  1-Acute hypoxic respiratory failure secondary to COVID-19 pneumonia: Procalcitonin level 0.2 Continue with Remdesivir  and IVF steroid day 3 Continue with Baracitinib day 3. Prone as tolerated.  Incentive spirometry. She is requiring less oxygen today down to 10 L from 15.  Inflammatory markers trending down COVID-19 Labs  Recent Labs    10/23/20 2125 10/23/20 2222 10/24/20 0453 10/25/20 0425  DDIMER 1.13*  --  1.02* 0.93*  FERRITIN  --  1,223*  --   --   LDH  --  408*  --   --   CRP  --  17.6* 14.8* 8.6*    Lab Results  Component Value Date   SARSCOV2NAA POSITIVE (A) 10/23/2020   2-transaminases; : Related to COVID-19 follow trend   Estimated body mass index is 26.96 kg/m as calculated from the following:   Height as of this encounter: 5' 8.9" (1.75 m).   Weight as of this encounter: 82.6 kg.   DVT prophylaxis: Lovenox Code Status: Full code Family Communication: care discussed with son Disposition Plan:  Status is: Inpatient  Remains inpatient appropriate because:IV treatments appropriate due to intensity of illness or inability to take PO   Dispo: The patient is from: Home               Anticipated d/c is to: Home              Anticipated d/c date is: 3 days              Patient currently is not medically stable to d/c.   Difficult to place patient No        Consultants:   none  Procedures:   none  Antimicrobials:    Subjective: She is feeling better, denies chest pain,. Dyspnea improved.  Still requiring 10 L oxygen.   Objective: Vitals:   10/24/20 2138 10/24/20 2346 10/25/20 0014 10/25/20 0425  BP: 129/71  123/71 135/80  Pulse: 67 66 61 75  Resp: 17  18 18   Temp: 98.3 F (36.8 C)  97.9 F (36.6 C) 98 F (36.7 C)  TempSrc: Oral  Oral Oral  SpO2: 95% 93% 97% 90%  Weight:      Height:        Intake/Output Summary (Last 24 hours) at 10/25/2020 0717 Last data filed at 10/24/2020 1945 Gross per 24 hour  Intake 100 ml  Output -  Net 100 ml   Filed Weights   10/23/20 1432  Weight: 82.6 kg    Examination:  General exam: Appears calm and comfortable  Respiratory system: B/L ronchus Cardiovascular system: S1 & S2 heard, RRR. No JVD, murmurs, rubs, gallops or clicks. No pedal edema. Gastrointestinal system: Abdomen is  nondistended, soft and nontender. No organomegaly or masses felt. Normal bowel sounds heard. Central nervous system: Alert and oriented.  Extremities: Symmetric 5 x 5 power.   Data Reviewed: I have personally reviewed following labs and imaging studies  CBC: Recent Labs  Lab 10/23/20 1445 10/23/20 2222 10/24/20 0453 10/25/20 0425  WBC 6.3 5.1 3.6* 4.9  NEUTROABS 5.5 4.1 2.9 3.9  HGB 13.0 12.7 11.9* 12.1  HCT 39.1 38.4 34.8* 37.2  MCV 78.8* 80.0 79.1* 80.0  PLT 241 227 212 268   Basic Metabolic Panel: Recent Labs  Lab 10/23/20 1445 10/24/20 0453 10/25/20 0425  NA 133* 136 137  K 4.3 4.2 4.1  CL 99 103 101  CO2 23 23 24   GLUCOSE 133* 145* 141*  BUN 10 7 15   CREATININE 0.76 0.63 0.67  CALCIUM 8.5* 8.3* 8.7*   GFR: Estimated Creatinine Clearance: 93.2 mL/min (by C-G formula based on SCr of 0.67  mg/dL). Liver Function Tests: Recent Labs  Lab 10/23/20 1445 10/24/20 0453 10/25/20 0425  AST 69* 78* 75*  ALT 51* 56* 70*  ALKPHOS 115 101 102  BILITOT 0.8 0.9 0.6  PROT 6.8 6.1* 6.5  ALBUMIN 2.8* 2.6* 2.6*   No results for input(s): LIPASE, AMYLASE in the last 168 hours. No results for input(s): AMMONIA in the last 168 hours. Coagulation Profile: No results for input(s): INR, PROTIME in the last 168 hours. Cardiac Enzymes: No results for input(s): CKTOTAL, CKMB, CKMBINDEX, TROPONINI in the last 168 hours. BNP (last 3 results) No results for input(s): PROBNP in the last 8760 hours. HbA1C: No results for input(s): HGBA1C in the last 72 hours. CBG: No results for input(s): GLUCAP in the last 168 hours. Lipid Profile: Recent Labs    10/23/20 2157  TRIG 119   Thyroid Function Tests: No results for input(s): TSH, T4TOTAL, FREET4, T3FREE, THYROIDAB in the last 72 hours. Anemia Panel: Recent Labs    10/23/20 2222  FERRITIN 1,223*   Sepsis Labs: Recent Labs  Lab 10/23/20 1504 10/23/20 2222 10/24/20 0915 10/25/20 0425  PROCALCITON  --  0.26 0.23 0.10  LATICACIDVEN 1.0  --   --   --     Recent Results (from the past 240 hour(s))  Blood culture (routine x 2)     Status: None (Preliminary result)   Collection Time: 10/23/20  2:35 PM   Specimen: BLOOD  Result Value Ref Range Status   Specimen Description BLOOD BLOOD RIGHT HAND  Final   Special Requests   Final    BOTTLES DRAWN AEROBIC AND ANAEROBIC Blood Culture adequate volume   Culture   Final    NO GROWTH 1 DAY Performed at [facility], [address] [place], [address] [place]    Report Status PENDING  Incomplete  SARS Coronavirus 2 by RT PCR (hospital order, performed in [facility] hospital lab) Nasopharyngeal Nasopharyngeal Swab     Status: Abnormal   Collection Time: 10/23/20  2:36 PM   Specimen: Nasopharyngeal Swab  Result Value Ref Range Status   SARS Coronavirus 2 POSITIVE (A) NEGATIVE Final     Comment: RESULT CALLED TO, READ BACK BY AND VERIFIED WITH: RN L [facility] AT 1611 BY CM (NOTE) SARS-CoV-2 target nucleic acids are DETECTED  SARS-CoV-2 RNA is generally detectable in upper respiratory specimens  during the acute phase of infection.  Positive results are indicative  of the presence of the identified virus, but do not rule out bacterial infection or co-infection with other pathogens not  detected by the test.  Clinical correlation with patient history and  other diagnostic information is necessary to determine patient infection status.  The expected result is negative.  Fact Sheet for Patients:   BoilerBrush.com.cy   Fact Sheet for Healthcare Providers:   https://pope.com/    This test is not yet approved or cleared by the [place] FDA and  has been authorized for detection and/or diagnosis of SARS-CoV-2 by FDA under an Emergency Use Authorization (EUA).  This EUA will remain in effect (meaning this  test can be used) for the duration of  the COVID-19 declaration under Section 564(b)(1) of the Act, 21 U.S.C. section 360-bbb-3(b)(1), unless the authorization is terminated or revoked sooner.  Performed at [facility], [address], [place], [place] [address]   Blood culture (routine x 2)     Status: None (Preliminary result)   Collection Time: 10/23/20 10:10 PM   Specimen: BLOOD  Result Value Ref Range Status   Specimen Description BLOOD SITE NOT SPECIFIED  Final   Special Requests   Final    BOTTLES DRAWN AEROBIC AND ANAEROBIC Blood Culture adequate volume   Culture   Final    NO GROWTH < 24 HOURS Performed at [facility], [address], [place], [place] [address]    Report Status PENDING  Incomplete         Radiology Studies: DG Chest Portable 1 View  Result Date: 10/23/2020 CLINICAL DATA:  55 year old female with cough and fever. EXAM: PORTABLE CHEST 1 VIEW COMPARISON:   None. FINDINGS: Bilateral pulmonary opacities, left greater right most consistent with multifocal pneumonia. Clinical correlation and follow-up to resolution recommended. There is no pleural effusion pneumothorax. Mild cardiomegaly. No acute osseous pathology. IMPRESSION: Multifocal pneumonia. Electronically Signed   By: Elgie Collard M.D.   On: 10/23/2020 15:18        Scheduled Meds: . vitamin C  500 mg Oral Daily  . baricitinib  4 mg Oral Daily  . cholecalciferol  1,000 Units Oral Daily  . enoxaparin (LOVENOX) injection  40 mg Subcutaneous Q24H  . methylPREDNISolone (SOLU-MEDROL) injection  0.5 mg/kg Intravenous Q12H   Followed by  . [START ON 10/27/2020] predniSONE  50 mg Oral Daily  . zinc sulfate  220 mg Oral Daily   Continuous Infusions: . remdesivir 100 mg in NS 100 mL Stopped (10/24/20 1945)     LOS: 2 days    Time spent:35 minutes.     Sabrina Cory, Sabrina Russell [facility]   If 7PM-7AM, please contact night-coverage [email or web address]  10/25/2020, 7:17 AM

## 2020-10-25 NOTE — Evaluation (Signed)
Physical Therapy Evaluation Patient Details Name: Antonio Woodhams MRN: [id number] DOB: [birth year] Today's Date: 10/25/2020   History of Present Illness  Sabrina Russell is a 55 y.o. female with no significant past medical history presenting with complaints of fever and cough.  Patient states she has been sick for the past 2 weeks.  She is having fevers and cough.  Her appetite is poor.  She is having nausea but has not vomited.  She is also having some diarrhea.  Denies abdominal pain.  She feels her breathing is okay.  Denies chest pain.  She is not vaccinated against COVID.  States her husband is also sick with similar symptoms.  Denies history of any medical problems.  Clinical Impression  Patient received in bed, on HFNC. Reports she is feeling better. Wants to go home. Patient is independent with bed mobility and transfers. She ambulated around room with saturations dropping to 80%. (approx 15 feet ) With brief seated rest saturations returned to 90%. She got up to sink to brush teeth with saturations dropping again to 81%. Patient will continue to benefit from skilled PT while here to improve activity tolerance and O2 management for return home.      Follow Up Recommendations No PT follow up    Equipment Recommendations  None recommended by PT    Recommendations for Other Services       Precautions / Restrictions Precautions Precautions: Fall Precaution Comments: low fall Restrictions Weight Bearing Restrictions: No      Mobility  Bed Mobility Overal bed mobility: Independent                  Transfers Overall transfer level: Independent                  Ambulation/Gait Ambulation/Gait assistance: Supervision Gait Distance (Feet): 25 Feet Assistive device: None Gait Pattern/deviations: WFL(Within Functional Limits) Gait velocity: decreased mainly due to lines   General Gait Details: patient steady with mobility. No assistance needed. Has decrease  in O2 saturations to 80% with mobility in room. Patient remained on HFNC throughout session.  Stairs            Wheelchair Mobility    Modified Rankin (Stroke Patients Only)       Balance Overall balance assessment: Mild deficits observed, not formally tested                                           Pertinent Vitals/Pain Pain Assessment: No/denies pain    Home Living Family/patient expects to be discharged to:: Private residence Living Arrangements: Spouse/significant other Available Help at Discharge: Family;Available PRN/intermittently Type of Home: House       Home Layout: One level Home Equipment: None      Prior Function Level of Independence: Independent               Hand Dominance        Extremity/Trunk Assessment   Upper Extremity Assessment Upper Extremity Assessment: Defer to OT evaluation    Lower Extremity Assessment Lower Extremity Assessment: Overall WFL for tasks assessed    Cervical / Trunk Assessment Cervical / Trunk Assessment: Normal  Communication   Communication: Prefers language other than English  Cognition Arousal/Alertness: Awake/alert Behavior During Therapy: WFL for tasks assessed/performed Overall Cognitive Status: Within Functional Limits for tasks assessed  General Comments      Exercises Other Exercises Other Exercises: B LE LAQ seated on edge of bed x 10   Assessment/Plan    PT Assessment Patient needs continued PT services  PT Problem List Decreased mobility;Decreased activity tolerance;Cardiopulmonary status limiting activity;Decreased safety awareness       PT Treatment Interventions Therapeutic activities;Gait training;Therapeutic exercise;Functional mobility training;Patient/family education    PT Goals (Current goals can be found in the Care Plan section)  Acute Rehab PT Goals Patient Stated Goal: return home PT Goal  Formulation: With patient Time For Goal Achievement: 10/31/20 Potential to Achieve Goals: Good    Frequency Min 2X/week   Barriers to discharge        Co-evaluation               AM-PAC PT "6 Clicks" Mobility  Outcome Measure Help needed turning from your back to your side while in a flat bed without using bedrails?: None Help needed moving from lying on your back to sitting on the side of a flat bed without using bedrails?: None Help needed moving to and from a bed to a chair (including a wheelchair)?: None Help needed standing up from a chair using your arms (e.g., wheelchair or bedside chair)?: None Help needed to walk in hospital room?: A Little Help needed climbing 3-5 steps with a railing? : A Little 6 Click Score: 22    End of Session Equipment Utilized During Treatment: Oxygen Activity Tolerance: Other (comment) (limited by O2 saturations) Patient left: in bed Nurse Communication: Mobility status PT Visit Diagnosis: Other abnormalities of gait and mobility (R26.89)    Time: 8921-1941 PT Time Calculation (min) (ACUTE ONLY): 11 min   Charges:   PT Evaluation $PT Eval Moderate Complexity: 1 Mod          Cannan Beeck, PT, GCS 10/25/20,10:06 AM

## 2020-10-25 NOTE — Evaluation (Addendum)
Occupational Therapy Evaluation Patient Details Name: Sabrina Russell MRN: [id number] DOB: [birth year] Today's Date: 10/25/2020    History of Present Illness Sabrina Russell is a 55 y.o. female with no significant past medical history presenting with complaints of fever and cough.  Patient states she has been sick for the past 2 weeks.  She is having fevers and cough.  Her appetite is poor.  She is having nausea but has not vomited.  She is also having some diarrhea.  Denies abdominal pain.  She feels her breathing is okay.  Denies chest pain.  She is not vaccinated against COVID.  States her husband is also sick with similar symptoms.  Denies history of any medical problems.   Clinical Impression   PTA, pt lives with spouse (who she reports is hospitalized here with COVID too?) and Independent in all ADLs, IADLs and mobility. Pt presents now with deficits in cardiopulmonary tolerance and endurance though demonstrates ability to complete ADLs/transfers Independently. Educated on energy conservation strategies (plan to provide spanish verision of handout) and use of BSC as shower chair while working to re-build endurance. Anticipate no skilled OT services needed at discharge but pt would benefit from OT services at acute level to reinforce energy conservation and maximize endurance.   Pt received on 10 L HFNC, SpO2 >90% at rest. After transfer and toileting task, SpO2 at 80%. Recovers to >90% within 1.5 minutes with seated rest break and cues for pursed lip breathing.     Follow Up Recommendations  No OT follow up;Supervision - Intermittent    Equipment Recommendations  3 in 1 bedside commode    Recommendations for Other Services       Precautions / Restrictions Precautions Precautions: Fall;Other (comment) Precaution Comments: low fall, monitor O2 Restrictions Weight Bearing Restrictions: No      Mobility Bed Mobility Overal bed mobility: Independent                   Transfers Overall transfer level: Independent Equipment used: None             General transfer comment: Independent for sit to stand and pivot to [facility] without AD. No portable O2 tank in room, so unable to safely attempt mobility to bathroom without tank    Balance Overall balance assessment: No apparent balance deficits (not formally assessed)                                         ADL either performed or assessed with clinical judgement   ADL Overall ADL's : Independent                                       General ADL Comments: Pt able to don socks sitting EOB, transfer to [facility] and complete toileting task independently. No physical assist needed and no LOB     Vision Baseline Vision/History: No visual deficits Patient Visual Report: No change from baseline Vision Assessment?: No apparent visual deficits     Perception     Praxis      Pertinent Vitals/Pain Pain Assessment: No/denies pain     Hand Dominance Right   Extremity/Trunk Assessment Upper Extremity Assessment Upper Extremity Assessment: Overall WFL for tasks assessed   Lower Extremity Assessment Lower Extremity Assessment: Defer to PT evaluation   Cervical /  Trunk Assessment Cervical / Trunk Assessment: Normal   Communication Communication Communication: Prefers language other than English (Spanish is 1st language but speaks english fairly well)   Cognition Arousal/Alertness: Awake/alert Behavior During Therapy: WFL for tasks assessed/performed Overall Cognitive Status: Within Functional Limits for tasks assessed                                     General Comments  Pt received on 10 L HFNC, spO2 >90% at rest. After toileting task, desat to 80% but recovers to 90% with seated rest break and cues for pursed lip breathing (within 1.5 minutes). Pt with 1/4 DOE. Pt reports her husband is also in hospital with COVID, unsure of what room number. Educated  on use of pulse oximeter and where to obtain them to monitor O2. Educated on appropriate O2 levels and accurate pleths on monitor.    Exercises Other Exercises Other Exercises: B LE LAQ seated on edge of bed x 10   Shoulder Instructions      Home Living Family/patient expects to be discharged to:: Private residence Living Arrangements: Spouse/significant other Available Help at Discharge: Family;Available PRN/intermittently Type of Home: House Home Access: Level entry     Home Layout: One level     Bathroom Shower/Tub: Producer, television/film/video: Standard     Home Equipment: Hand held shower head          Prior Functioning/Environment Level of Independence: Independent                 OT Problem List: Decreased activity tolerance;Cardiopulmonary status limiting activity;Decreased knowledge of use of DME or AE      OT Treatment/Interventions: Self-care/ADL training;Therapeutic exercise;Energy conservation;DME and/or AE instruction;Therapeutic activities;Patient/family education    OT Goals(Current goals can be found in the care plan section) Acute Rehab OT Goals Patient Stated Goal: return home OT Goal Formulation: With patient Time For Goal Achievement: 11/08/20 Potential to Achieve Goals: Good ADL Goals Additional ADL Goal #1: Pt to verbalize at least 3 energy conservation strategies to implement during ADLs/IADLs at home Additional ADL Goal #2: Pt to independently monitor O2 and implement pursed lip breathing to maintain O2 >88% Additional ADL Goal #3: Pt to demonstrate ability to ambulate to/from bathroom independently with proper mgmt of O2 cord  OT Frequency: Min 2X/week   Barriers to D/C:            Co-evaluation              AM-PAC OT "6 Clicks" Daily Activity     Outcome Measure Help from another person eating meals?: None Help from another person taking care of personal grooming?: None Help from another person toileting, which  includes using toliet, bedpan, or urinal?: None Help from another person bathing (including washing, rinsing, drying)?: None Help from another person to put on and taking off regular upper body clothing?: None Help from another person to put on and taking off regular lower body clothing?: None 6 Click Score: 24   End of Session Equipment Utilized During Treatment: Oxygen Nurse Communication: Mobility status;Other (comment) (O2)  Activity Tolerance: Patient tolerated treatment well Patient left: in bed;with call bell/phone within reach  OT Visit Diagnosis: Other (comment) (decreased cardiopulmonary tolerance)                Time: 1134-1150 OT Time Calculation (min): 16 min Charges:  OT General Charges $OT Visit:  1 Visit OT Evaluation $OT Eval Moderate Complexity: 1 Mod  Lorre Munroe, OTR/L  Lorre Munroe 10/25/2020, 1:01 PM

## 2020-10-25 NOTE — Progress Notes (Signed)
Patient arrived to unit 2 west bed 15 from emergency department. Bedside report given from ER nurse.Oriented patient to nursing unit and call bell system .Patient call bell,phone and  personal items within reach.No acute distress noted at present time.

## 2020-10-26 LAB — CBC WITH DIFFERENTIAL/PLATELET
Abs Immature Granulocytes: 0 10*3/uL (ref 0.00–0.07)
Basophils Absolute: 0 10*3/uL (ref 0.0–0.1)
Basophils Relative: 0 %
Eosinophils Absolute: 0 10*3/uL (ref 0.0–0.5)
Eosinophils Relative: 0 %
HCT: 34.5 % — ABNORMAL LOW (ref 36.0–46.0)
Hemoglobin: 11.9 g/dL — ABNORMAL LOW (ref 12.0–15.0)
Lymphocytes Relative: 3 %
Lymphs Abs: 0.2 10*3/uL — ABNORMAL LOW (ref 0.7–4.0)
MCH: 27.2 pg (ref 26.0–34.0)
MCHC: 34.5 g/dL (ref 30.0–36.0)
MCV: 78.9 fL — ABNORMAL LOW (ref 80.0–100.0)
Monocytes Absolute: 0.1 10*3/uL (ref 0.1–1.0)
Monocytes Relative: 2 %
Neutro Abs: 6.8 10*3/uL (ref 1.7–7.7)
Neutrophils Relative %: 95 %
Platelets: 358 10*3/uL (ref 150–400)
RBC: 4.37 MIL/uL (ref 3.87–5.11)
RDW: 13.4 % (ref 11.5–15.5)
WBC: 7.2 10*3/uL (ref 4.0–10.5)
nRBC: 0 % (ref 0.0–0.2)
nRBC: 0 /100 WBC

## 2020-10-26 LAB — COMPREHENSIVE METABOLIC PANEL
ALT: 60 U/L — ABNORMAL HIGH (ref 0–44)
AST: 38 U/L (ref 15–41)
Albumin: 2.5 g/dL — ABNORMAL LOW (ref 3.5–5.0)
Alkaline Phosphatase: 86 U/L (ref 38–126)
Anion gap: 10 (ref 5–15)
BUN: 17 mg/dL (ref 6–20)
CO2: 26 mmol/L (ref 22–32)
Calcium: 8.7 mg/dL — ABNORMAL LOW (ref 8.9–10.3)
Chloride: 104 mmol/L (ref 98–111)
Creatinine, Ser: 0.63 mg/dL (ref 0.44–1.00)
GFR, Estimated: >60 mL/min (ref >60)
Glucose, Bld: 143 mg/dL — ABNORMAL HIGH (ref 70–99)
Potassium: 4.6 mmol/L (ref 3.5–5.1)
Sodium: 140 mmol/L (ref 135–145)
Total Bilirubin: 0.6 mg/dL (ref 0.3–1.2)
Total Protein: 5.9 g/dL — ABNORMAL LOW (ref 6.5–8.1)

## 2020-10-26 LAB — C-REACTIVE PROTEIN: CRP: 3.9 mg/dL — ABNORMAL HIGH (ref <1.0)

## 2020-10-26 LAB — D-DIMER, QUANTITATIVE: D-Dimer, Quant: 0.8 ug/mL-FEU — ABNORMAL HIGH (ref 0.00–0.50)

## 2020-10-26 LAB — PROCALCITONIN: Procalcitonin: <0.1 ng/mL

## 2020-10-26 MED ORDER — METHYLPREDNISOLONE SODIUM SUCC 40 MG IJ SOLR
40.0000 mg | Freq: Two times a day (BID) | INTRAMUSCULAR | Status: DC
  Administered 2020-10-26 – 2020-10-28 (×4): 40 mg via INTRAVENOUS
  Filled 2020-10-26 (×4): qty 1

## 2020-10-26 MED ORDER — POLYETHYLENE GLYCOL 3350 17 G PO PACK
17.0000 g | PACK | Freq: Every day | ORAL | Status: DC
  Administered 2020-10-26 – 2020-10-28 (×3): 17 g via ORAL
  Filled 2020-10-26 (×4): qty 1

## 2020-10-26 NOTE — Plan of Care (Signed)
O2 decreased to 5L HF . Encouraged use of ISP.Denies pain or discomfort at this time.

## 2020-10-26 NOTE — Progress Notes (Signed)
Occupational Therapy Treatment Patient Details Name: Sabrina Russell MRN: [id number] DOB: Jan 11, 1966 Today's Date: 10/26/2020    History of present illness Sabrina Russell is a 55 y.o. female with no significant past medical history presenting with complaints of fever and cough.  Patient states she has been sick for the past 2 weeks.  She is having fevers and cough.  Her appetite is poor.  She is having nausea but has not vomited.  She is also having some diarrhea.  Denies abdominal pain.  She feels her breathing is okay.  Denies chest pain.  She is not vaccinated against COVID.  States her husband is also sick with similar symptoms.  Denies history of any medical problems.   OT comments  Pt progressing towards acute OT goals. Pt able to walk to bathroom and complete toilet transfer and pericare at supervision level. Received on 5L HFNC. Did need to go up 7L at midpoint of OOB activity with sats dropping to a lowest point of 73. DOE 1/4. Once pt in recliner, encouraged pursed lip breathing exercises. After about 5 minutes pt able to decrease O2 needs back to 5L with sats 92-93. Provided energy conservation educational handout in Spanish. D/c plan remain appropriate.    Follow Up Recommendations  No OT follow up;Supervision - Intermittent    Equipment Recommendations  3 in 1 bedside commode    Recommendations for Other Services      Precautions / Restrictions Precautions Precautions: Fall;Other (comment) Precaution Comments: low fall, monitor O2 Restrictions Weight Bearing Restrictions: No       Mobility Bed Mobility Overal bed mobility: Independent                Transfers Overall transfer level: Needs assistance Equipment used: None Transfers: Sit to/from Stand Sit to Stand: Supervision         General transfer comment: mod I on intital stand, supervision to transfer into recliner at end of session. No physical assist or AD needed,    Balance Overall balance  assessment: No apparent balance deficits (not formally assessed)                                         ADL either performed or assessed with clinical judgement   ADL Overall ADL's : Modified independent                                       General ADL Comments: a little extra time to complete ADLs and mobility. No physical assist.     Vision       Perception     Praxis      Cognition Arousal/Alertness: Awake/alert Behavior During Therapy: WFL for tasks assessed/performed Overall Cognitive Status: Within Functional Limits for tasks assessed                                          Exercises     Shoulder Instructions       General Comments      Pertinent Vitals/ Pain       Pain Assessment: No/denies pain  Home Living  Prior Functioning/Environment              Frequency  Min 2X/week        Progress Toward Goals  OT Goals(current goals can now be found in the care plan section)  Progress towards OT goals: Progressing toward goals  Acute Rehab OT Goals Patient Stated Goal: return home OT Goal Formulation: With patient Time For Goal Achievement: 11/08/20 Potential to Achieve Goals: Good ADL Goals Additional ADL Goal #1: Pt to verbalize at least 3 energy conservation strategies to implement during ADLs/IADLs at home Additional ADL Goal #2: Pt to independently monitor O2 and implement pursed lip breathing to maintain O2 >88% Additional ADL Goal #3: Pt to demonstrate ability to ambulate to/from bathroom independently with proper mgmt of O2 cord  Plan Discharge plan remains appropriate    Co-evaluation                 AM-PAC OT "6 Clicks" Daily Activity     Outcome Measure   Help from another person eating meals?: None Help from another person taking care of personal grooming?: None Help from another person toileting, which  includes using toliet, bedpan, or urinal?: None Help from another person bathing (including washing, rinsing, drying)?: A Little Help from another person to put on and taking off regular upper body clothing?: None Help from another person to put on and taking off regular lower body clothing?: None 6 Click Score: 23    End of Session Equipment Utilized During Treatment: Oxygen  OT Visit Diagnosis: Other (comment) (decreased cardiopulmonary tolerance)   Activity Tolerance Patient tolerated treatment well;Other (comment) (DOE 1/5 needed to go from 5 to 7L HFNC O2 for OOB activity)   Patient Left with call bell/phone within reach;in chair   Nurse Communication Mobility status;Other (comment) (O2)        Time: 1230-1300 OT Time Calculation (min): 30 min  Charges: OT General Charges $OT Visit: 1 Visit OT Treatments $Self Care/Home Management : 23-37 mins  Raynald Kemp, OT Acute Rehabilitation Services Pager: [phone] Office: [phone]    Pilar Grammes 10/26/2020, 2:09 PM

## 2020-10-26 NOTE — Progress Notes (Signed)
PROGRESS NOTE    Sabrina Russell  WER:[id number] DOB: 02-22-1966 DOA: 10/23/2020 PCP: Pcp, No   Brief Narrative: 55 year old with no past medical history who presents complaining of fever and cough.  She has been sick for 2 weeks.  She report poor appetite.  She also report mild diarrhea.  She was not vaccinated against Covid. She was found to be febrile, positive for COVID-19, chest x-ray with bilateral pulmonary opacity, hypoxic requiring 3 to 4 L.  Subsequently her oxygen requirement increased to 10 L. She has been getting Remdesivir, IV steroid and Baracitinib.     Assessment & Plan:   Principal Problem:   Pneumonia due to COVID-19 virus Active Problems:   Acute hypoxemic respiratory failure (HCC)   Sepsis (HCC)   Gastroenteritis due to COVID-19 virus  1-Acute hypoxic respiratory failure secondary to COVID-19 pneumonia: Procalcitonin level 0.2 Continue with Remdesivir  and IVF steroid day 4 Continue with Baracitinib day 4. Prone as tolerated.  Incentive spirometry. She is requiring less oxygen today down to 5. Required 7 L on exertion.  Inflammatory markers continue to decreased.   COVID-19 Labs  Recent Labs    10/23/20 2222 10/24/20 0453 10/25/20 0425 10/26/20 0310  DDIMER  --  1.02* 0.93* 0.80*  FERRITIN 1,223*  --   --   --   LDH 408*  --   --   --   CRP 17.6* 14.8* 8.6* 3.9*    Lab Results  Component Value Date   SARSCOV2NAA POSITIVE (A) 10/23/2020   2-transaminases; : Related to COVID-19 follow trend Improving.   Estimated body mass index is 26.96 kg/m as calculated from the following:   Height as of this encounter: 5' 8.9" (1.75 m).   Weight as of this encounter: 82.6 kg.   DVT prophylaxis: Lovenox Code Status: Full code Family Communication: care discussed with son Disposition Plan:  Status is: Inpatient  Remains inpatient appropriate because:IV treatments appropriate due to intensity of illness or inability to take PO   Dispo: The  patient is from: Home              Anticipated d/c is to: Home              Anticipated d/c date is: 2 days              Patient currently is not medically stable to d/c. She will need Home oxygen.    Difficult to place patient No        Consultants:   none  Procedures:   none  Antimicrobials:    Subjective: She is feeling better, dyspnea and cough improved.  She has not had BM.   Objective: Vitals:   10/25/20 1825 10/25/20 2225 10/26/20 0534 10/26/20 1100  BP: 128/73 (!) 126/59 124/77   Pulse: 65 62 60   Resp: 16 17 18    Temp: 98.5 F (36.9 C) 98.2 F (36.8 C) 97.9 F (36.6 C)   TempSrc:      SpO2:  93% 93% 92%  Weight:      Height:       No intake or output data in the 24 hours ending 10/26/20 1444 Filed Weights   10/23/20 1432  Weight: 82.6 kg    Examination:  General exam: NAD Respiratory system: Crackles bases Cardiovascular system: S 1, S 2 RRR Gastrointestinal system: BS present, soft, nt Central nervous system: Alert and oriented Extremities: Symmetric power.    Data Reviewed: I have personally reviewed following labs and imaging  studies  CBC: Recent Labs  Lab 10/23/20 1445 10/23/20 2222 10/24/20 0453 10/25/20 0425 10/26/20 0310  WBC 6.3 5.1 3.6* 4.9 7.2  NEUTROABS 5.5 4.1 2.9 3.9 6.8  HGB 13.0 12.7 11.9* 12.1 11.9*  HCT 39.1 38.4 34.8* 37.2 34.5*  MCV 78.8* 80.0 79.1* 80.0 78.9*  PLT 241 227 212 268 358   Basic Metabolic Panel: Recent Labs  Lab 10/23/20 1445 10/24/20 0453 10/25/20 0425 10/26/20 0310  NA 133* 136 137 140  K 4.3 4.2 4.1 4.6  CL 99 103 101 104  CO2 23 23 24 26   GLUCOSE 133* 145* 141* 143*  BUN 10 7 15 17   CREATININE 0.76 0.63 0.67 0.63  CALCIUM 8.5* 8.3* 8.7* 8.7*   GFR: Estimated Creatinine Clearance: 93.2 mL/min (by C-G formula based on SCr of 0.63 mg/dL). Liver Function Tests: Recent Labs  Lab 10/23/20 1445 10/24/20 0453 10/25/20 0425 10/26/20 0310  AST 69* 78* 75* 38  ALT 51* 56* 70* 60*   ALKPHOS 115 101 102 86  BILITOT 0.8 0.9 0.6 0.6  PROT 6.8 6.1* 6.5 5.9*  ALBUMIN 2.8* 2.6* 2.6* 2.5*   No results for input(s): LIPASE, AMYLASE in the last 168 hours. No results for input(s): AMMONIA in the last 168 hours. Coagulation Profile: No results for input(s): INR, PROTIME in the last 168 hours. Cardiac Enzymes: No results for input(s): CKTOTAL, CKMB, CKMBINDEX, TROPONINI in the last 168 hours. BNP (last 3 results) No results for input(s): PROBNP in the last 8760 hours. HbA1C: No results for input(s): HGBA1C in the last 72 hours. CBG: No results for input(s): GLUCAP in the last 168 hours. Lipid Profile: Recent Labs    10/23/20 2157  TRIG 119   Thyroid Function Tests: No results for input(s): TSH, T4TOTAL, FREET4, T3FREE, THYROIDAB in the last 72 hours. Anemia Panel: Recent Labs    10/23/20 2222  FERRITIN 1,223*   Sepsis Labs: Recent Labs  Lab 10/23/20 1504 10/23/20 2222 10/24/20 0915 10/25/20 0425 10/26/20 0310  PROCALCITON  --  0.26 0.23 0.10 <0.10  LATICACIDVEN 1.0  --   --   --   --     Recent Results (from the past 240 hour(s))  Blood culture (routine x 2)     Status: None (Preliminary result)   Collection Time: 10/23/20  2:35 PM   Specimen: BLOOD  Result Value Ref Range Status   Specimen Description BLOOD BLOOD RIGHT HAND  Final   Special Requests   Final    BOTTLES DRAWN AEROBIC AND ANAEROBIC Blood Culture adequate volume   Culture   Final    NO GROWTH 3 DAYS Performed at [facility], [address], [place], [address] [place]    Report Status PENDING  Incomplete  SARS Coronavirus 2 by RT PCR (hospital order, performed in [facility] hospital lab) Nasopharyngeal Nasopharyngeal Swab     Status: Abnormal   Collection Time: 10/23/20  2:36 PM   Specimen: Nasopharyngeal Swab  Result Value Ref Range Status   SARS Coronavirus 2 POSITIVE (A) NEGATIVE Final    Comment: RESULT CALLED TO, READ BACK BY AND VERIFIED WITH: [facility]  AT 1611 BY CM (NOTE) SARS-CoV-2 target nucleic acids are DETECTED  SARS-CoV-2 RNA is generally detectable in upper respiratory specimens  during the acute phase of infection.  Positive results are indicative  of the presence of the identified virus, but do not rule out bacterial infection or co-infection with other pathogens not detected by the test.  Clinical  correlation with patient history and  other diagnostic information is necessary to determine patient infection status.  The expected result is negative.  Fact Sheet for Patients:   BoilerBrush.com.cy   Fact Sheet for Healthcare Providers:   https://pope.com/    This test is not yet approved or cleared by the [place] FDA and  has been authorized for detection and/or diagnosis of SARS-CoV-2 by FDA under an Emergency Use Authorization (EUA).  This EUA will remain in effect (meaning this  test can be used) for the duration of  the COVID-19 declaration under Section 564(b)(1) of the Act, 21 U.S.C. section 360-bbb-3(b)(1), unless the authorization is terminated or revoked sooner.  Performed at [facility], [address] [place], [place] [address]   Blood culture (routine x 2)     Status: None (Preliminary result)   Collection Time: 10/23/20 10:10 PM   Specimen: BLOOD  Result Value Ref Range Status   Specimen Description BLOOD SITE NOT SPECIFIED  Final   Special Requests   Final    BOTTLES DRAWN AEROBIC AND ANAEROBIC Blood Culture adequate volume   Culture   Final    NO GROWTH 3 DAYS Performed at [facility], [address] [place], [place] [address]    Report Status PENDING  Incomplete         Radiology Studies: No results found.      Scheduled Meds:  vitamin C  500 mg Oral Daily   baricitinib  4 mg Oral Daily   cholecalciferol  1,000 Units Oral Daily   enoxaparin (LOVENOX) injection  40 mg Subcutaneous Q24H   methylPREDNISolone  (SOLU-MEDROL) injection  40 mg Intravenous Q12H   polyethylene glycol  17 g Oral Daily   zinc sulfate  220 mg Oral Daily   Continuous Infusions:  remdesivir 100 mg in NS 100 mL 100 mg (10/26/20 1033)     LOS: 3 days    Time spent:35 minutes.     Alba Cory, MD [facility]   If 7PM-7AM, please contact night-coverage [email or web address]  10/26/2020, 2:44 PM

## 2020-10-26 NOTE — Plan of Care (Signed)
  Problem: Education: Goal: Knowledge of risk factors and measures for prevention of condition will improve Outcome: Progressing   Problem: Coping: Goal: Psychosocial and spiritual needs will be supported Outcome: Progressing   Problem: Respiratory: Goal: Will maintain a patent airway Outcome: Progressing Goal: Complications related to the disease process, condition or treatment will be avoided or minimized Outcome: Progressing   Problem: Education: Goal: Knowledge of General Education information will improve Description: Including pain rating scale, medication(s)/side effects and non-pharmacologic comfort measures Outcome: Progressing   Problem: Health Behavior/Discharge Planning: Goal: Ability to manage health-related needs will improve Outcome: Progressing   Problem: Clinical Measurements: Goal: Ability to maintain clinical measurements within normal limits will improve Outcome: Progressing Goal: Diagnostic test results will improve Outcome: Progressing   Problem: Activity: Goal: Risk for activity intolerance will decrease Outcome: Progressing   Problem: Nutrition: Goal: Adequate nutrition will be maintained Outcome: Progressing   Problem: Coping: Goal: Level of anxiety will decrease Outcome: Progressing   Problem: Elimination: Goal: Will not experience complications related to bowel motility Outcome: Progressing   Problem: Pain Managment: Goal: General experience of comfort will improve Outcome: Progressing   Problem: Safety: Goal: Ability to remain free from injury will improve Outcome: Progressing   Problem: Skin Integrity: Goal: Risk for impaired skin integrity will decrease Outcome: Progressing

## 2020-10-27 LAB — CBC WITH DIFFERENTIAL/PLATELET
Abs Immature Granulocytes: 0.13 10*3/uL — ABNORMAL HIGH (ref 0.00–0.07)
Basophils Absolute: 0 10*3/uL (ref 0.0–0.1)
Basophils Relative: 0 %
Eosinophils Absolute: 0 10*3/uL (ref 0.0–0.5)
Eosinophils Relative: 0 %
HCT: 36.7 % (ref 36.0–46.0)
Hemoglobin: 11.9 g/dL — ABNORMAL LOW (ref 12.0–15.0)
Immature Granulocytes: 2 %
Lymphocytes Relative: 11 %
Lymphs Abs: 0.8 10*3/uL (ref 0.7–4.0)
MCH: 26.3 pg (ref 26.0–34.0)
MCHC: 32.4 g/dL (ref 30.0–36.0)
MCV: 81 fL (ref 80.0–100.0)
Monocytes Absolute: 0.6 10*3/uL (ref 0.1–1.0)
Monocytes Relative: 9 %
Neutro Abs: 5.3 10*3/uL (ref 1.7–7.7)
Neutrophils Relative %: 78 %
Platelets: 374 10*3/uL (ref 150–400)
RBC: 4.53 MIL/uL (ref 3.87–5.11)
RDW: 13.2 % (ref 11.5–15.5)
WBC: 6.8 10*3/uL (ref 4.0–10.5)
nRBC: 0 % (ref 0.0–0.2)

## 2020-10-27 LAB — COMPREHENSIVE METABOLIC PANEL
ALT: 51 U/L — ABNORMAL HIGH (ref 0–44)
AST: 30 U/L (ref 15–41)
Albumin: 2.4 g/dL — ABNORMAL LOW (ref 3.5–5.0)
Alkaline Phosphatase: 87 U/L (ref 38–126)
Anion gap: 7 (ref 5–15)
BUN: 16 mg/dL (ref 6–20)
CO2: 24 mmol/L (ref 22–32)
Calcium: 8.6 mg/dL — ABNORMAL LOW (ref 8.9–10.3)
Chloride: 107 mmol/L (ref 98–111)
Creatinine, Ser: 0.59 mg/dL (ref 0.44–1.00)
GFR, Estimated: >60 mL/min (ref >60)
Glucose, Bld: 113 mg/dL — ABNORMAL HIGH (ref 70–99)
Potassium: 4.5 mmol/L (ref 3.5–5.1)
Sodium: 138 mmol/L (ref 135–145)
Total Bilirubin: 0.5 mg/dL (ref 0.3–1.2)
Total Protein: 5.7 g/dL — ABNORMAL LOW (ref 6.5–8.1)

## 2020-10-27 LAB — C-REACTIVE PROTEIN: CRP: 1.9 mg/dL — ABNORMAL HIGH (ref <1.0)

## 2020-10-27 LAB — D-DIMER, QUANTITATIVE: D-Dimer, Quant: 0.91 ug/mL-FEU — ABNORMAL HIGH (ref 0.00–0.50)

## 2020-10-27 NOTE — Plan of Care (Signed)
  Problem: Education: Goal: Knowledge of risk factors and measures for prevention of condition will improve Outcome: Progressing   Problem: Coping: Goal: Psychosocial and spiritual needs will be supported Outcome: Progressing   Problem: Respiratory: Goal: Will maintain a patent airway Outcome: Progressing Goal: Complications related to the disease process, condition or treatment will be avoided or minimized Outcome: Progressing   Problem: Education: Goal: Knowledge of General Education information will improve Description: Including pain rating scale, medication(s)/side effects and non-pharmacologic comfort measures Outcome: Progressing   Problem: Health Behavior/Discharge Planning: Goal: Ability to manage health-related needs will improve Outcome: Progressing   Problem: Clinical Measurements: Goal: Ability to maintain clinical measurements within normal limits will improve Outcome: Progressing Goal: Diagnostic test results will improve Outcome: Progressing   Problem: Activity: Goal: Risk for activity intolerance will decrease Outcome: Progressing   Problem: Nutrition: Goal: Adequate nutrition will be maintained Outcome: Progressing   Problem: Coping: Goal: Level of anxiety will decrease Outcome: Progressing   Problem: Elimination: Goal: Will not experience complications related to bowel motility Outcome: Progressing   Problem: Pain Managment: Goal: General experience of comfort will improve Outcome: Progressing   Problem: Safety: Goal: Ability to remain free from injury will improve Outcome: Progressing   Problem: Skin Integrity: Goal: Risk for impaired skin integrity will decrease Outcome: Progressing   

## 2020-10-27 NOTE — TOC Initial Note (Signed)
Transition of Care [facility]) - Initial/Assessment Note    Patient Details  Name: Sabrina Russell MRN: [id number] Date of Birth: [birth year]  Transition of Care [facility]) CM/SW Contact:    Lorri [place], LCSW Phone Number: 10/27/2020, 10:55 AM  Clinical Narrative:   Phone assessment completed using [employer] interpretors.  Pt completed most of the interview in English at her own initiative and the interpreter assisted as needed.  Pt reports she does have PCP at [facility] on [facility] in [place]. (this was later verified)  Pt lives with husband, son is available to help and provides transportation.  Pt is not vaccinated for covid.  Discussed MD recommendation for home O2 and pt does not have a current provider.  No DME equipment in home either.  Pt agreeable to CSW setting up home O2.                Expected Discharge Plan: Home/Self Care Barriers to Discharge: Continued Medical Work up (uninsured)   Patient Goals and CMS Choice Patient states their goals for this hospitalization and ongoing recovery are:: feel better CMS Medicare.gov Compare Post Acute Care list provided to::  (na)    Expected Discharge Plan and Services Expected Discharge Plan: Home/Self Care In-house Referral: Clinical Social Work Discharge Planning Services: CM Consult Post Acute Care Choice: Durable Medical Equipment (O2) Living arrangements for the past 2 months: Single Family Home                                      Prior Living Arrangements/Services Living arrangements for the past 2 months: Single Family Home Lives with:: Spouse Patient language and need for interpreter reviewed:: Yes ([employer] interpret[employer] used for spanish) Do you feel safe going back to the place where you live?: Yes      Need for Family Participation in Patient Care: No (Comment) Care giver support system in place?: Yes (comment) Current home services: Other (comment) (none) Criminal Activity/Legal Involvement Pertinent  to Current Situation/Hospitalization: No - Comment as needed  Activities of Daily Living Home Assistive Devices/Equipment: None ADL Screening (condition at time of admission) Patient's cognitive ability adequate to safely complete daily activities?: Yes Is the patient deaf or have difficulty hearing?: No Does the patient have difficulty seeing, even when wearing glasses/contacts?: No Does the patient have difficulty concentrating, remembering, or making decisions?: No Patient able to express need for assistance with ADLs?: No Does the patient have difficulty dressing or bathing?: No Independently performs ADLs?: Yes (appropriate for developmental age) Does the patient have difficulty walking or climbing stairs?: No Weakness of Legs: None Weakness of Arms/Hands: None  Permission Sought/Granted Permission sought to share information with : Family Supports Permission granted to share information with : Yes, Verbal Permission Granted  Share Information with NAME: son, Derek [place]           Emotional Assessment Appearance:: Other (Comment Required (phone assessment) Attitude/Demeanor/Rapport: Engaged Affect (typically observed): Appropriate,Pleasant Orientation: : Oriented to Self,Oriented to Place,Oriented to Situation,Oriented to  Time Alcohol / Substance Use: Not Applicable Psych Involvement: No (comment)  Admission diagnosis:  Acute respiratory failure with hypoxia (HCC) [J96.01] COVID-19 virus infection [U07.1] COVID-19 [U07.1] Patient Active Problem List   Diagnosis Date Noted  . Acute hypoxemic respiratory failure (HCC) 10/23/2020  . Sepsis (HCC) 10/23/2020  . Gastroenteritis due to COVID-19 virus 10/23/2020  . Pneumonia due to COVID-19 virus 10/23/2020   PCP:  Pcp,  No Pharmacy:   [facility] [address] [place], [place] - [address] [place] [address] Phone: [phone] Fax: [phone]     Social Determinants of Health (SDOH)  Interventions    Readmission Risk Interventions Readmission Risk Prevention Plan 10/27/2020  Post Dischage Appt Not Complete  Appt Comments first available appt 2/14  Transportation Screening Complete

## 2020-10-27 NOTE — Progress Notes (Signed)
Physical Therapy Treatment Patient Details Name: Sabrina Russell MRN: [id number] DOB: Sep 12, 1966 Today's Date: 10/27/2020    History of Present Illness Sabrina Russell is a 55 y.o. female with no significant past medical history presenting with complaints of fever and cough.  Patient states she has been sick for the past 2 weeks.  She is having fevers and cough.  Her appetite is poor.  She is having nausea but has not vomited.  She is also having some diarrhea.  Denies abdominal pain.  She feels her breathing is okay.  Denies chest pain.  She is not vaccinated against COVID.  States her husband is also sick with similar symptoms.  Denies history of any medical problems.    PT Comments    Pt standing in room on arrival this session.  Pt mobilizing well.  Spo2 drops remain to limit function.  Educated on rest periods and pacing.  Pt tolerated standing exercises.  Performed walking O2 sats to prepare for O2 needs to return home.      Follow Up Recommendations  No PT follow up     Equipment Recommendations  None recommended by PT    Recommendations for Other Services       Precautions / Restrictions Precautions Precautions: Fall;Other (comment) Precaution Comments: low fall, monitor O2 Restrictions Weight Bearing Restrictions: No    Mobility  Bed Mobility                  Transfers Overall transfer level: Independent   Transfers: Sit to/from Stand Sit to Stand: Independent            Ambulation/Gait Ambulation/Gait assistance: Supervision (for pacing with O2 levels otherwise independent) Gait Distance (Feet): 90 Feet Assistive device: None Gait Pattern/deviations: WFL(Within Functional Limits) Gait velocity: decreased mainly due to lines   General Gait Details: Pt continues to be steady with mobility.  Performed walking O2 assessment and decreased to 84% on RA, tolerated further activity well on 3L [place].   Stairs             Wheelchair Mobility     Modified Rankin (Stroke Patients Only)       Balance                                            Cognition Arousal/Alertness: Awake/alert Behavior During Therapy: WFL for tasks assessed/performed Overall Cognitive Status: Within Functional Limits for tasks assessed                                        Exercises General Exercises - Lower Extremity Hip ABduction/ADduction: AROM;Both;10 reps;Standing Hip Flexion/Marching: AROM;Both;10 reps;Standing Heel Raises: AROM;Both;10 reps;Standing Mini-Sqauts: AROM;Both;10 reps;Standing    General Comments        Pertinent Vitals/Pain Pain Assessment: No/denies pain    Home Living                      Prior Function            PT Goals (current goals can now be found in the care plan section) Acute Rehab PT Goals Patient Stated Goal: return home Potential to Achieve Goals: Good Progress towards PT goals: Progressing toward goals    Frequency    Min 2X/week      PT  Plan Current plan remains appropriate    Co-evaluation              AM-PAC PT "6 Clicks" Mobility   Outcome Measure  Help needed turning from your back to your side while in a flat bed without using bedrails?: None Help needed moving from lying on your back to sitting on the side of a flat bed without using bedrails?: None Help needed moving to and from a bed to a chair (including a wheelchair)?: None Help needed standing up from a chair using your arms (e.g., wheelchair or bedside chair)?: None Help needed to walk in hospital room?: A Little Help needed climbing 3-5 steps with a railing? : A Little 6 Click Score: 22    End of Session Equipment Utilized During Treatment: Oxygen Activity Tolerance: Other (comment) (limited by O2 saturations.) Patient left: in bed Nurse Communication: Mobility status PT Visit Diagnosis: Other abnormalities of gait and mobility (R26.89)     Time: 4782-9562 PT  Time Calculation (min) (ACUTE ONLY): 23 min  Charges:  $Gait Training: 8-22 mins $Therapeutic Exercise: 8-22 mins                     Bonney Leitz , PTA Acute Rehabilitation Services Pager [phone] Office [phone]     Estefany Goebel Artis Delay 10/27/2020, 6:15 PM

## 2020-10-27 NOTE — Outcomes Assessment (Signed)
SATURATION QUALIFICATIONS: (This note is used to comply with regulatory documentation for home oxygen)  Patient Saturations on Room Air at Rest = 87%  Patient Saturations on Room Air while Ambulating = 84%  Patient Saturations on 3 Liters of oxygen while Ambulating = 91%  Please briefly explain why patient needs home oxygen: desat at rest and with exertion on RA.  Quentin Angst Acute Rehabilitation Services Pager [phone] Office [phone]

## 2020-10-27 NOTE — Progress Notes (Signed)
PROGRESS NOTE    Sabrina Russell  XIP:[id number] DOB: 04/10/1966 DOA: 10/23/2020 PCP: Pcp, No   Brief Narrative: 55 year old with no past medical history who presents complaining of fever and cough.  She has been sick for 2 weeks.  She report poor appetite.  She also report mild diarrhea.  She was not vaccinated against Covid. She was found to be febrile, positive for COVID-19, chest x-ray with bilateral pulmonary opacity, hypoxic requiring 3 to 4 L.  Subsequently her oxygen requirement increased to 10 L. She received Remdesivir, IV steroid and Baracitinib.     Assessment & Plan:   Principal Problem:   Pneumonia due to COVID-19 virus Active Problems:   Acute hypoxemic respiratory failure (HCC)   Sepsis (HCC)   Gastroenteritis due to COVID-19 virus  1-Acute hypoxic respiratory failure secondary to COVID-19 pneumonia: Procalcitonin level 0.2 Received 4 days of  Remdesivir. Continue with IV dexamethasone.  Continue with Baracitinib day 5. Prone as tolerated.  Incentive spirometry. She is requiring less oxygen today down to 5. Required 7 L on exertion.  Inflammatory markers continue to decreased.  Plan to discharge home tomorrow if oxygen could be arrange.   COVID-19 Labs  Recent Labs    10/25/20 0425 10/26/20 0310 10/27/20 0343  DDIMER 0.93* 0.80* 0.91*  CRP 8.6* 3.9* 1.9*    Lab Results  Component Value Date   SARSCOV2NAA POSITIVE (A) 10/23/2020   2-transaminases; : Related to COVID-19 follow trend Improving.   Estimated body mass index is 26.96 kg/m as calculated from the following:   Height as of this encounter: 5' 8.9" (1.75 m).   Weight as of this encounter: 82.6 kg.   DVT prophylaxis: Lovenox Code Status: Full code Family Communication: care discussed with son 1/26 Disposition Plan:  Status is: Inpatient  Remains inpatient appropriate because:IV treatments appropriate due to intensity of illness or inability to take PO   Dispo: The patient is  from: Home              Anticipated d/c is to: Home              Anticipated d/c date is: 2 days              Patient currently is not medically stable to d/c. She will need Home oxygen.    Difficult to place patient No        Consultants:   none  Procedures:   none  Antimicrobials:    Subjective: She is feeling better, dyspnea improved.   Objective: Vitals:   10/26/20 0534 10/26/20 1100 10/26/20 1400 10/27/20 0551  BP: 124/77  114/65 139/78  Pulse: 60  (!) 57 (!) 54  Resp: 18  17   Temp: 97.9 F (36.6 C)  98.3 F (36.8 C) (!) 97.4 F (36.3 C)  TempSrc:      SpO2: 93% 92%  95%  Weight:      Height:       No intake or output data in the 24 hours ending 10/27/20 1227 Filed Weights   10/23/20 1432  Weight: 82.6 kg    Examination:  General exam: NAD Respiratory system: CTA Cardiovascular system: S 1, S 2 RRR Gastrointestinal system: BS present, soft, nt Central nervous system: alert and orineted Extremities: Symmetric power   Data Reviewed: I have personally reviewed following labs and imaging studies  CBC: Recent Labs  Lab 10/23/20 2222 10/24/20 0453 10/25/20 0425 10/26/20 0310 10/27/20 0343  WBC 5.1 3.6* 4.9 7.2 6.8  NEUTROABS  4.1 2.9 3.9 6.8 5.3  HGB 12.7 11.9* 12.1 11.9* 11.9*  HCT 38.4 34.8* 37.2 34.5* 36.7  MCV 80.0 79.1* 80.0 78.9* 81.0  PLT 227 212 268 358 374   Basic Metabolic Panel: Recent Labs  Lab 10/23/20 1445 10/24/20 0453 10/25/20 0425 10/26/20 0310 10/27/20 0343  NA 133* 136 137 140 138  K 4.3 4.2 4.1 4.6 4.5  CL 99 103 101 104 107  CO2 23 23 24 26 24   GLUCOSE 133* 145* 141* 143* 113*  BUN 10 7 15 17 16   CREATININE 0.76 0.63 0.67 0.63 0.59  CALCIUM 8.5* 8.3* 8.7* 8.7* 8.6*   GFR: Estimated Creatinine Clearance: 93.2 mL/min (by C-G formula based on SCr of 0.59 mg/dL). Liver Function Tests: Recent Labs  Lab 10/23/20 1445 10/24/20 0453 10/25/20 0425 10/26/20 0310 10/27/20 0343  AST 69* 78* 75* 38 30  ALT  51* 56* 70* 60* 51*  ALKPHOS 115 101 102 86 87  BILITOT 0.8 0.9 0.6 0.6 0.5  PROT 6.8 6.1* 6.5 5.9* 5.7*  ALBUMIN 2.8* 2.6* 2.6* 2.5* 2.4*   No results for input(s): LIPASE, AMYLASE in the last 168 hours. No results for input(s): AMMONIA in the last 168 hours. Coagulation Profile: No results for input(s): INR, PROTIME in the last 168 hours. Cardiac Enzymes: No results for input(s): CKTOTAL, CKMB, CKMBINDEX, TROPONINI in the last 168 hours. BNP (last 3 results) No results for input(s): PROBNP in the last 8760 hours. HbA1C: No results for input(s): HGBA1C in the last 72 hours. CBG: No results for input(s): GLUCAP in the last 168 hours. Lipid Profile: No results for input(s): CHOL, HDL, LDLCALC, TRIG, CHOLHDL, LDLDIRECT in the last 72 hours. Thyroid Function Tests: No results for input(s): TSH, T4TOTAL, FREET4, T3FREE, THYROIDAB in the last 72 hours. Anemia Panel: No results for input(s): VITAMINB12, FOLATE, FERRITIN, TIBC, IRON, RETICCTPCT in the last 72 hours. Sepsis Labs: Recent Labs  Lab 10/23/20 1504 10/23/20 2222 10/24/20 0915 10/25/20 0425 10/26/20 0310  PROCALCITON  --  0.26 0.23 0.10 <0.10  LATICACIDVEN 1.0  --   --   --   --     Recent Results (from the past 240 hour(s))  Blood culture (routine x 2)     Status: None (Preliminary result)   Collection Time: 10/23/20  2:35 PM   Specimen: BLOOD  Result Value Ref Range Status   Specimen Description BLOOD BLOOD RIGHT HAND  Final   Special Requests   Final    BOTTLES DRAWN AEROBIC AND ANAEROBIC Blood Culture adequate volume   Culture   Final    NO GROWTH 3 DAYS Performed at [facility], [address] [place], [address] [place]    Report Status PENDING  Incomplete  SARS Coronavirus 2 by RT PCR (hospital order, performed in [facility] hospital lab) Nasopharyngeal Nasopharyngeal Swab     Status: Abnormal   Collection Time: 10/23/20  2:36 PM   Specimen: Nasopharyngeal Swab  Result Value Ref Range Status    SARS Coronavirus 2 POSITIVE (A) NEGATIVE Final    Comment: RESULT CALLED TO, READ BACK BY AND VERIFIED WITH: [facility] AT 1611 BY CM (NOTE) SARS-CoV-2 target nucleic acids are DETECTED  SARS-CoV-2 RNA is generally detectable in upper respiratory specimens  during the acute phase of infection.  Positive results are indicative  of the presence of the identified virus, but do not rule out bacterial infection or co-infection with other pathogens not detected by the test.  Clinical correlation with  patient history and  other diagnostic information is necessary to determine patient infection status.  The expected result is negative.  Fact Sheet for Patients:   BoilerBrush.com.cy   Fact Sheet for Healthcare Providers:   https://pope.com/    This test is not yet approved or cleared by the [place] FDA and  has been authorized for detection and/or diagnosis of SARS-CoV-2 by FDA under an Emergency Use Authorization (EUA).  This EUA will remain in effect (meaning this  test can be used) for the duration of  the COVID-19 declaration under Section 564(b)(1) of the Act, 21 U.S.C. section 360-bbb-3(b)(1), unless the authorization is terminated or revoked sooner.  Performed at [facility], [address] [place], [place] [address]   Blood culture (routine x 2)     Status: None (Preliminary result)   Collection Time: 10/23/20 10:10 PM   Specimen: BLOOD  Result Value Ref Range Status   Specimen Description BLOOD SITE NOT SPECIFIED  Final   Special Requests   Final    BOTTLES DRAWN AEROBIC AND ANAEROBIC Blood Culture adequate volume   Culture   Final    NO GROWTH 3 DAYS Performed at [facility], [address] [place], [place] [address]    Report Status PENDING  Incomplete         Radiology Studies: No results found.      Scheduled Meds: . vitamin C  500 mg Oral Daily  . baricitinib  4 mg Oral  Daily  . cholecalciferol  1,000 Units Oral Daily  . enoxaparin (LOVENOX) injection  40 mg Subcutaneous Q24H  . methylPREDNISolone (SOLU-MEDROL) injection  40 mg Intravenous Q12H  . polyethylene glycol  17 g Oral Daily  . zinc sulfate  220 mg Oral Daily   Continuous Infusions: . remdesivir 100 mg in NS 100 mL 100 mg (10/26/20 1033)     LOS: 4 days    Time spent:35 minutes.     Alba Cory, MD [facility]   If 7PM-7AM, please contact night-coverage [email or web address]  10/27/2020, 12:27 PM

## 2020-10-28 ENCOUNTER — Other Ambulatory Visit (HOSPITAL_COMMUNITY): Payer: Self-pay | Admitting: Internal Medicine

## 2020-10-28 LAB — CULTURE, BLOOD (ROUTINE X 2)
Culture: NO GROWTH
Culture: NO GROWTH
Special Requests: ADEQUATE
Special Requests: ADEQUATE

## 2020-10-28 MED ORDER — ASCORBIC ACID 500 MG PO TABS: 500.0000 mg | ORAL_TABLET | Freq: Every day | ORAL | 0 refills | Status: DC

## 2020-10-28 MED ORDER — ASCORBIC ACID 500 MG PO TABS: 500.0000 mg | ORAL_TABLET | Freq: Every day | ORAL | 0 refills | Status: AC

## 2020-10-28 MED ORDER — GUAIFENESIN-DM 100-10 MG/5ML PO SYRP: 10.0000 mL | ORAL_SOLUTION | ORAL | 0 refills | Status: DC | PRN

## 2020-10-28 MED ORDER — GUAIFENESIN-DM 100-10 MG/5ML PO SYRP: 10.0000 mL | ORAL_SOLUTION | ORAL | 0 refills | Status: AC | PRN

## 2020-10-28 MED ORDER — ZINC SULFATE 220 (50 ZN) MG PO CAPS: 220.0000 mg | ORAL_CAPSULE | Freq: Every day | ORAL | 0 refills | Status: AC

## 2020-10-28 MED ORDER — ALBUTEROL SULFATE HFA 108 (90 BASE) MCG/ACT IN AERS: 2.0000 | INHALATION_SPRAY | Freq: Four times a day (QID) | RESPIRATORY_TRACT | 0 refills | Status: DC | PRN

## 2020-10-28 MED ORDER — ACETAMINOPHEN 325 MG PO TABS: 650.0000 mg | ORAL_TABLET | Freq: Four times a day (QID) | ORAL | 0 refills | Status: DC | PRN

## 2020-10-28 MED ORDER — PREDNISONE 10 MG PO TABS: 40.0000 mg | ORAL_TABLET | Freq: Every day | ORAL | 0 refills | Status: DC

## 2020-10-28 MED ORDER — VITAMIN D3 25 MCG PO TABS: 1000.0000 [IU] | ORAL_TABLET | Freq: Every day | ORAL | 0 refills | Status: AC

## 2020-10-28 MED ORDER — ZINC SULFATE 220 (50 ZN) MG PO CAPS: 220.0000 mg | ORAL_CAPSULE | Freq: Every day | ORAL | 0 refills | Status: DC

## 2020-10-28 MED FILL — ACETAMINOPHEN 325 MG TABS: 325 | 1 days supply | Qty: 10 | Fill #0

## 2020-10-28 MED FILL — VITAMIN C 500 MG TABLET: 500 | 30 days supply | Qty: 30 | Fill #0

## 2020-10-28 MED FILL — ROBAFEN DM CGH-CHEST CONG S: 10-100 | 2 days supply | Qty: 118 | Fill #0

## 2020-10-28 MED FILL — ZINC SULFATE 220 MG TABLET: 220 (50 ZN) | 30 days supply | Qty: 30 | Fill #0

## 2020-10-28 MED FILL — predniSONE 10 MG TABS: 10 | 4 days supply | Qty: 16 | Fill #0

## 2020-10-28 NOTE — Discharge Instructions (Signed)
You need to [facility] for 3 weeks from your test (test result 1/23.)tient q have quarentena por tres semanas apartir de 1/23.

## 2020-10-28 NOTE — Discharge Summary (Signed)
Physician Discharge Summary  Sabrina Russell ZYS:[id number] DOB: [birth year] DOA: 10/23/2020  PCP: Oneita Hurt, No  Admit date: 10/23/2020 Discharge date: 10/28/2020  Admitted From: Home  Disposition: Home   Recommendations for Outpatient Follow-up:  1. Follow up with PCP in 1-2 weeks 2. Please obtain BMP/CBC in one week 3. Follow up on need for oxygen.  4. Repeat LFT to document resolution of transaminases.   Home Health: None Equipment/Devices: Oxygen   Discharge Condition: Stable.  CODE STATUS: Full code Diet recommendation: Heart Healthy   Brief/Interim Summary: 55 year old with no past medical history who presents complaining of fever and cough.  She has been sick for 2 weeks.  She report poor appetite.  She also report mild diarrhea.  She was not vaccinated against Covid. She was found to be febrile, positive for COVID-19, chest x-ray with bilateral pulmonary opacity, hypoxic requiring 3 to 4 L.  Subsequently her oxygen requirement increased to 10 L. She received Remdesivir, IV steroid and Baracitinib.    1-Acute hypoxic respiratory failure secondary to COVID-19 pneumonia: Procalcitonin level 0.2 Received 4 days of  Remdesivir. Treated with  IV dexamethasone for 4 days, discharge on prednisone for 3 days. .  Received  Baracitinib for  5 days. Prone as tolerated.  Incentive spirometry. She is requiring less oxygen today down to 3 on room air.  Inflammatory markers continue to decreased.  Stable for discharge, Home oxygen has been arrange.   COVID-19 Labs  Recent Labs (last 2 labs)        Recent Labs    10/25/20 0425 10/26/20 0310 10/27/20 0343  DDIMER 0.93* 0.80* 0.91*  CRP 8.6* 3.9* 1.9*      Recent Labs       Lab Results  Component Value Date   SARSCOV2NAA POSITIVE (A) 10/23/2020     2-transaminases; : Related to COVID-19 follow trend Improving.   3-She should establish care with PCP   Discharge Diagnoses:  Principal Problem:   Pneumonia due to  COVID-19 virus Active Problems:   Acute hypoxemic respiratory failure (HCC)   Sepsis (HCC)   Gastroenteritis due to COVID-19 virus    Discharge Instructions  Discharge Instructions    Diet - low sodium heart healthy   Complete by: As directed    Increase activity slowly   Complete by: As directed      Allergies as of 10/28/2020   No Known Allergies     Medication List    STOP taking these medications   DAYQUIL MULTI-SYMPTOM PO   NYQUIL PO     TAKE these medications   acetaminophen 325 MG tablet Commonly known as: TYLENOL Take 2 tablets (650 mg total) by mouth every 6 (six) hours as needed for mild pain or headache (fever >/= 101).   albuterol 108 (90 Base) MCG/ACT inhaler Commonly known as: VENTOLIN HFA Inhale 2 puffs into the lungs every 6 (six) hours as needed for wheezing or shortness of breath.   ascorbic acid 500 MG tablet Commonly known as: VITAMIN C Take 1 tablet (500 mg total) by mouth daily. Start taking on: October 29, 2020   aspirin 325 MG tablet Take 325 mg by mouth daily as needed for mild pain, fever or headache.   guaiFENesin-dextromethorphan 100-10 MG/5ML syrup Commonly known as: ROBITUSSIN DM Take 10 mLs by mouth every 4 (four) hours as needed for cough.   predniSONE 10 MG tablet Commonly known as: DELTASONE Take 4 tablets (40 mg total) by mouth daily for 4 days.   Vitamin D3  25 MCG tablet Commonly known as: Vitamin D Take 1 tablet (1,000 Units total) by mouth daily. Start taking on: October 29, 2020   zinc sulfate 220 (50 Zn) MG capsule Take 1 capsule (220 mg total) by mouth daily. Start taking on: October 29, 2020            Durable Medical Equipment  (From admission, onward)         Start     Ordered   10/28/20 1143  For home use only DME oxygen  Once       Question Answer Comment  Length of Need 6 Months   Mode or (Route) Nasal cannula   Liters per Minute 3   Frequency Continuous (stationary and portable oxygen unit  needed)   Oxygen delivery system Gas      10/28/20 1142   10/26/20 1443  For home use only DME 3 n 1  Once        10/26/20 1442          Follow-up Information    [facility] [place]. Go on 11/14/2020.   Why: Please attend your hospital discharge appointment with Dr Katrinka Blazing on Monday, 11/14/20, at 1:30pm. Contact information:  [address]  [place], [place] [address] P: [phone]             No Known Allergies  Consultations: None  Procedures/Studies: DG Chest Portable 1 View  Result Date: 10/23/2020 CLINICAL DATA:  55 year old female with cough and fever. EXAM: PORTABLE CHEST 1 VIEW COMPARISON:  None. FINDINGS: Bilateral pulmonary opacities, left greater right most consistent with multifocal pneumonia. Clinical correlation and follow-up to resolution recommended. There is no pleural effusion pneumothorax. Mild cardiomegaly. No acute osseous pathology. IMPRESSION: Multifocal pneumonia. Electronically Signed   By: Elgie Collard M.D.   On: 10/23/2020 15:18     Subjective: She is feeling better, dyspnea has improved.   Discharge Exam: Vitals:   10/27/20 2154 10/28/20 0626  BP: 121/73 119/80  Pulse: 60 63  Resp: 18 19  Temp: 98 F (36.7 C) 98.3 F (36.8 C)  SpO2: 97% 93%     General: Pt is alert, awake, not in acute distress Cardiovascular: RRR, S1/S2 +, no rubs, no gallops Respiratory: CTA bilaterally, no wheezing, no rhonchi Abdominal: Soft, NT, ND, bowel sounds + Extremities: no edema, no cyanosis    The results of significant diagnostics from this hospitalization (including imaging, microbiology, ancillary and laboratory) are listed below for reference.     Microbiology: Recent Results (from the past 240 hour(s))  Blood culture (routine x 2)     Status: None   Collection Time: 10/23/20  2:35 PM   Specimen: BLOOD  Result Value Ref Range Status   Specimen Description BLOOD BLOOD RIGHT HAND  Final   Special Requests   Final    BOTTLES DRAWN  AEROBIC AND ANAEROBIC Blood Culture adequate volume   Culture   Final    NO GROWTH 5 DAYS Performed at [facility], [address], [place], [place] [address]    Report Status 10/28/2020 FINAL  Final  SARS Coronavirus 2 by RT PCR (hospital order, performed in [facility] hospital lab) Nasopharyngeal Nasopharyngeal Swab     Status: Abnormal   Collection Time: 10/23/20  2:36 PM   Specimen: Nasopharyngeal Swab  Result Value Ref Range Status   SARS Coronavirus 2 POSITIVE (A) NEGATIVE Final    Comment: RESULT CALLED TO, READ BACK BY AND VERIFIED WITH: RN L  CAPAHIMICAN [id number] AT 1611 BY CM (NOTE) SARS-CoV-2 target nucleic acids are DETECTED  SARS-CoV-2 RNA is generally detectable in upper respiratory specimens  during the acute phase of infection.  Positive results are indicative  of the presence of the identified virus, but do not rule out bacterial infection or co-infection with other pathogens not detected by the test.  Clinical correlation with patient history and  other diagnostic information is necessary to determine patient infection status.  The expected result is negative.  Fact Sheet for Patients:   BoilerBrush.com.cy   Fact Sheet for Healthcare Providers:   https://pope.com/    This test is not yet approved or cleared by the [place] FDA and  has been authorized for detection and/or diagnosis of SARS-CoV-2 by FDA under an Emergency Use Authorization (EUA).  This EUA will remain in effect (meaning this  test can be used) for the duration of  the COVID-19 declaration under Section 564(b)(1) of the Act, 21 U.S.C. section 360-bbb-3(b)(1), unless the authorization is terminated or revoked sooner.  Performed at [facility], [address] [place], [place] [address]   Blood culture (routine x 2)     Status: None   Collection Time: 10/23/20 10:10 PM   Specimen: BLOOD  Result Value Ref Range Status   Specimen  Description BLOOD SITE NOT SPECIFIED  Final   Special Requests   Final    BOTTLES DRAWN AEROBIC AND ANAEROBIC Blood Culture adequate volume   Culture   Final    NO GROWTH 5 DAYS Performed at [facility], [address] [place], [place] [address]    Report Status 10/28/2020 FINAL  Final     Labs: BNP (last 3 results) No results for input(s): BNP in the last 8760 hours. Basic Metabolic Panel: Recent Labs  Lab 10/23/20 1445 10/24/20 0453 10/25/20 0425 10/26/20 0310 10/27/20 0343  NA 133* 136 137 140 138  K 4.3 4.2 4.1 4.6 4.5  CL 99 103 101 104 107  CO2 23 23 24 26 24   GLUCOSE 133* 145* 141* 143* 113*  BUN 10 7 15 17 16   CREATININE 0.76 0.63 0.67 0.63 0.59  CALCIUM 8.5* 8.3* 8.7* 8.7* 8.6*   Liver Function Tests: Recent Labs  Lab 10/23/20 1445 10/24/20 0453 10/25/20 0425 10/26/20 0310 10/27/20 0343  AST 69* 78* 75* 38 30  ALT 51* 56* 70* 60* 51*  ALKPHOS 115 101 102 86 87  BILITOT 0.8 0.9 0.6 0.6 0.5  PROT 6.8 6.1* 6.5 5.9* 5.7*  ALBUMIN 2.8* 2.6* 2.6* 2.5* 2.4*   No results for input(s): LIPASE, AMYLASE in the last 168 hours. No results for input(s): AMMONIA in the last 168 hours. CBC: Recent Labs  Lab 10/23/20 2222 10/24/20 0453 10/25/20 0425 10/26/20 0310 10/27/20 0343  WBC 5.1 3.6* 4.9 7.2 6.8  NEUTROABS 4.1 2.9 3.9 6.8 5.3  HGB 12.7 11.9* 12.1 11.9* 11.9*  HCT 38.4 34.8* 37.2 34.5* 36.7  MCV 80.0 79.1* 80.0 78.9* 81.0  PLT 227 212 268 358 374   Cardiac Enzymes: No results for input(s): CKTOTAL, CKMB, CKMBINDEX, TROPONINI in the last 168 hours. BNP: Invalid input(s): POCBNP CBG: No results for input(s): GLUCAP in the last 168 hours. D-Dimer Recent Labs    10/26/20 0310 10/27/20 0343  DDIMER 0.80* 0.91*   Hgb A1c No results for input(s): HGBA1C in the last 72 hours. Lipid Profile No results for input(s): CHOL, HDL, LDLCALC, TRIG, CHOLHDL, LDLDIRECT in the last 72 hours. Thyroid function studies No  results for input(s): TSH,  T4TOTAL, T3FREE, THYROIDAB in the last 72 hours.  Invalid input(s): FREET3 Anemia work up No results for input(s): VITAMINB12, FOLATE, FERRITIN, TIBC, IRON, RETICCTPCT in the last 72 hours. Urinalysis No results found for: COLORURINE, APPEARANCEUR, LABSPEC, PHURINE, GLUCOSEU, HGBUR, BILIRUBINUR, KETONESUR, PROTEINUR, UROBILINOGEN, NITRITE, LEUKOCYTESUR Sepsis Labs Invalid input(s): PROCALCITONIN,  WBC,  LACTICIDVEN Microbiology Recent Results (from the past 240 hour(s))  Blood culture (routine x 2)     Status: None   Collection Time: 10/23/20  2:35 PM   Specimen: BLOOD  Result Value Ref Range Status   Specimen Description BLOOD BLOOD RIGHT HAND  Final   Special Requests   Final    BOTTLES DRAWN AEROBIC AND ANAEROBIC Blood Culture adequate volume   Culture   Final    NO GROWTH 5 DAYS Performed at [facility], [address] [place], [place] [address]    Report Status 10/28/2020 FINAL  Final  SARS Coronavirus 2 by RT PCR (hospital order, performed in [facility] hospital lab) Nasopharyngeal Nasopharyngeal Swab     Status: Abnormal   Collection Time: 10/23/20  2:36 PM   Specimen: Nasopharyngeal Swab  Result Value Ref Range Status   SARS Coronavirus 2 POSITIVE (A) NEGATIVE Final    Comment: RESULT CALLED TO, READ BACK BY AND VERIFIED WITH: RN L CAPAHIMICAN [id number] AT 1611 BY CM (NOTE) SARS-CoV-2 target nucleic acids are DETECTED  SARS-CoV-2 RNA is generally detectable in upper respiratory specimens  during the acute phase of infection.  Positive results are indicative  of the presence of the identified virus, but do not rule out bacterial infection or co-infection with other pathogens not detected by the test.  Clinical correlation with patient history and  other diagnostic information is necessary to determine patient infection status.  The expected result is negative.  Fact Sheet for Patients:   BoilerBrush.com.cy   Fact Sheet for Healthcare  Providers:   https://pope.com/    This test is not yet approved or cleared by the [place] FDA and  has been authorized for detection and/or diagnosis of SARS-CoV-2 by FDA under an Emergency Use Authorization (EUA).  This EUA will remain in effect (meaning this  test can be used) for the duration of  the COVID-19 declaration under Section 564(b)(1) of the Act, 21 U.S.C. section 360-bbb-3(b)(1), unless the authorization is terminated or revoked sooner.  Performed at [facility], [address] [place], [place] [address]   Blood culture (routine x 2)     Status: None   Collection Time: 10/23/20 10:10 PM   Specimen: BLOOD  Result Value Ref Range Status   Specimen Description BLOOD SITE NOT SPECIFIED  Final   Special Requests   Final    BOTTLES DRAWN AEROBIC AND ANAEROBIC Blood Culture adequate volume   Culture   Final    NO GROWTH 5 DAYS Performed at [facility], [address] [place], [place] [address]    Report Status 10/28/2020 FINAL  Final     Time coordinating discharge: 40 minutes  SIGNED:   Alba Cory, MD  [facility]

## 2020-10-28 NOTE — Plan of Care (Signed)
  Problem: Education: Goal: Knowledge of risk factors and measures for prevention of condition will improve Outcome: Progressing   Problem: Coping: Goal: Psychosocial and spiritual needs will be supported Outcome: Progressing   Problem: Respiratory: Goal: Will maintain a patent airway Outcome: Progressing Goal: Complications related to the disease process, condition or treatment will be avoided or minimized Outcome: Progressing   Problem: Education: Goal: Knowledge of General Education information will improve Description: Including pain rating scale, medication(s)/side effects and non-pharmacologic comfort measures Outcome: Progressing   Problem: Health Behavior/Discharge Planning: Goal: Ability to manage health-related needs will improve Outcome: Progressing   Problem: Clinical Measurements: Goal: Ability to maintain clinical measurements within normal limits will improve Outcome: Progressing Goal: Diagnostic test results will improve Outcome: Progressing   Problem: Activity: Goal: Risk for activity intolerance will decrease Outcome: Progressing   Problem: Nutrition: Goal: Adequate nutrition will be maintained Outcome: Progressing   Problem: Coping: Goal: Level of anxiety will decrease Outcome: Progressing   Problem: Elimination: Goal: Will not experience complications related to bowel motility Outcome: Progressing   Problem: Pain Managment: Goal: General experience of comfort will improve Outcome: Progressing   Problem: Safety: Goal: Ability to remain free from injury will improve Outcome: Progressing   Problem: Skin Integrity: Goal: Risk for impaired skin integrity will decrease Outcome: Progressing   

## 2020-10-28 NOTE — TOC Transition Note (Signed)
Transition of Care [facility]) - CM/SW Discharge Note   Patient Details  Name: Sabrina Russell MRN: [id number] Date of Birth: 31-Jan-1966  [facility] CM/SW Contact:  Lorri Frederick, LCSW Phone Number: 10/28/2020, 3:44 PM   Clinical Narrative:  Pt discharging home.  CSW spoke with son Derek Mound who will transport.  O2 provided by [employer].  Pt declines 3n1.  Medication from [facility] pharmacy.  No other needs identified.      Final next level of care: Home/Self Care Barriers to Discharge: Barriers Resolved   Patient Goals and CMS Choice Patient states their goals for this hospitalization and ongoing recovery are:: feel better CMS Medicare.gov Compare Post Acute Care list provided to::  (na)    Discharge Placement                       Discharge Plan and Services In-house Referral: Clinical Social Work Discharge Planning Services: CM Consult Post Acute Care Choice: Durable Medical Equipment (O2)          DME Arranged: Oxygen DME Agency: [employer] Date DME Agency Contacted: 10/28/20 Time DME Agency Contacted: [phone] Representative spoke with at DME Agency: Silvio Pate HH Arranged: NA HH Agency: NA        Social Determinants of Health (SDOH) Interventions     Readmission Risk Interventions Readmission Risk Prevention Plan 10/27/2020  Post Dischage Appt Not Complete  Appt Comments first available appt 2/14  Transportation Screening Complete

## 2022-06-30 IMAGING — DX DG CHEST 1V PORT
1 series · 1 of 1 positions shown · non-contrast
Comparison: None.

CLINICAL DATA: 54-year-old female with cough and fever.

EXAM:
PORTABLE CHEST 1 VIEW

[chest]
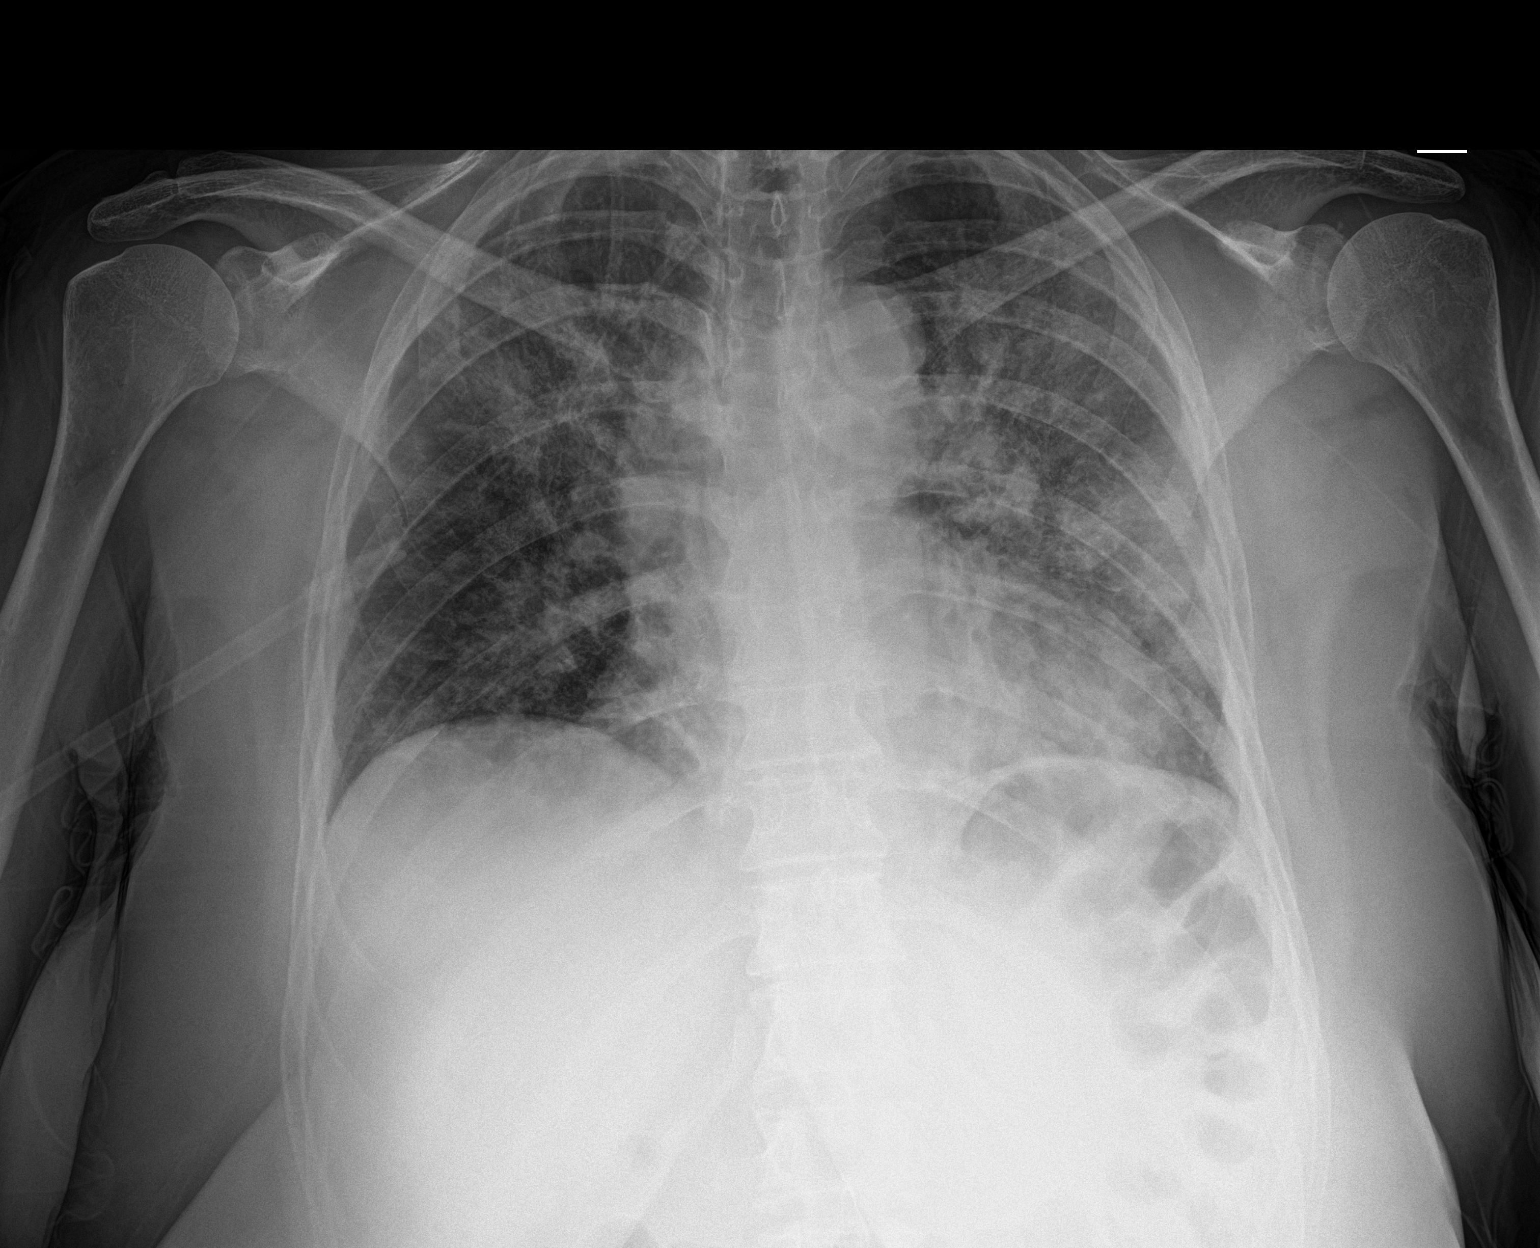

[1 of 1 positions shown; findings below may reference images not displayed]

FINDINGS: Bilateral pulmonary opacities, left greater right most consistent
with multifocal pneumonia. Clinical correlation and follow-up to
resolution recommended. There is no pleural effusion pneumothorax.
Mild cardiomegaly. No acute osseous pathology.
IMPRESSION: Multifocal pneumonia.
